# Patient Record
Sex: Female | Born: 1940 | Race: White | Hispanic: No | Smoking: Former smoker
Health system: Southern US, Community
[De-identification: ages and names within clinical notes are randomized; demographics above are authoritative.]

## PROBLEM LIST (undated history)

## (undated) DIAGNOSIS — M1990 Unspecified osteoarthritis, unspecified site: Secondary | ICD-10-CM

## (undated) DIAGNOSIS — L409 Psoriasis, unspecified: Secondary | ICD-10-CM

## (undated) DIAGNOSIS — K219 Gastro-esophageal reflux disease without esophagitis: Secondary | ICD-10-CM

## (undated) DIAGNOSIS — R350 Frequency of micturition: Secondary | ICD-10-CM

## (undated) DIAGNOSIS — R351 Nocturia: Secondary | ICD-10-CM

## (undated) DIAGNOSIS — I2510 Atherosclerotic heart disease of native coronary artery without angina pectoris: Secondary | ICD-10-CM

## (undated) DIAGNOSIS — E785 Hyperlipidemia, unspecified: Secondary | ICD-10-CM

## (undated) HISTORY — DX: Hyperlipidemia, unspecified: E78.5

## (undated) HISTORY — PX: EYE SURGERY: SHX253

## (undated) HISTORY — DX: Atherosclerotic heart disease of native coronary artery without angina pectoris: I25.10

---

## 2005-04-15 ENCOUNTER — Other Ambulatory Visit: Admission: RE | Admit: 2005-04-15 | Discharge: 2005-04-15 | Payer: Self-pay | Admitting: *Deleted

## 2005-11-30 ENCOUNTER — Ambulatory Visit (HOSPITAL_COMMUNITY): Admission: RE | Admit: 2005-11-30 | Discharge: 2005-11-30 | Payer: Self-pay | Admitting: *Deleted

## 2005-12-10 ENCOUNTER — Encounter: Admission: RE | Admit: 2005-12-10 | Discharge: 2005-12-10 | Payer: Self-pay | Admitting: *Deleted

## 2006-12-20 ENCOUNTER — Ambulatory Visit (HOSPITAL_COMMUNITY): Admission: RE | Admit: 2006-12-20 | Discharge: 2006-12-20 | Payer: Self-pay | Admitting: *Deleted

## 2007-04-25 ENCOUNTER — Other Ambulatory Visit: Admission: RE | Admit: 2007-04-25 | Discharge: 2007-04-25 | Payer: Self-pay | Admitting: *Deleted

## 2007-11-24 ENCOUNTER — Encounter: Admission: RE | Admit: 2007-11-24 | Discharge: 2007-11-24 | Payer: Self-pay

## 2008-01-23 ENCOUNTER — Ambulatory Visit (HOSPITAL_COMMUNITY): Admission: RE | Admit: 2008-01-23 | Discharge: 2008-01-23 | Payer: Self-pay | Admitting: Family Medicine

## 2009-01-30 ENCOUNTER — Ambulatory Visit (HOSPITAL_COMMUNITY): Admission: RE | Admit: 2009-01-30 | Discharge: 2009-01-30 | Payer: Self-pay | Admitting: Family Medicine

## 2010-02-02 ENCOUNTER — Ambulatory Visit (HOSPITAL_COMMUNITY): Admission: RE | Admit: 2010-02-02 | Discharge: 2010-02-02 | Payer: Self-pay | Admitting: Family Medicine

## 2010-03-16 ENCOUNTER — Encounter: Admission: RE | Admit: 2010-03-16 | Discharge: 2010-03-16 | Payer: Self-pay | Admitting: Family Medicine

## 2010-11-16 ENCOUNTER — Other Ambulatory Visit (HOSPITAL_COMMUNITY): Payer: Self-pay | Admitting: Rheumatology

## 2010-11-16 ENCOUNTER — Ambulatory Visit (HOSPITAL_COMMUNITY)
Admission: RE | Admit: 2010-11-16 | Discharge: 2010-11-16 | Disposition: A | Discharge status: Home / Self Care | Payer: Managed Care, Other (non HMO) | Source: Ambulatory Visit | Attending: Rheumatology | Admitting: Rheumatology

## 2010-11-16 DIAGNOSIS — A078 Other specified protozoal intestinal diseases: Secondary | ICD-10-CM

## 2010-11-16 DIAGNOSIS — M129 Arthropathy, unspecified: Secondary | ICD-10-CM | POA: Insufficient documentation

## 2011-03-04 ENCOUNTER — Other Ambulatory Visit (HOSPITAL_COMMUNITY): Payer: Self-pay | Admitting: Family Medicine

## 2011-03-04 DIAGNOSIS — Z1231 Encounter for screening mammogram for malignant neoplasm of breast: Secondary | ICD-10-CM

## 2011-03-12 ENCOUNTER — Ambulatory Visit (HOSPITAL_COMMUNITY)
Admission: RE | Admit: 2011-03-12 | Discharge: 2011-03-12 | Disposition: A | Discharge status: Home / Self Care | Payer: Managed Care, Other (non HMO) | Source: Ambulatory Visit | Attending: Family Medicine | Admitting: Family Medicine

## 2011-03-12 DIAGNOSIS — Z1231 Encounter for screening mammogram for malignant neoplasm of breast: Secondary | ICD-10-CM

## 2012-02-03 ENCOUNTER — Other Ambulatory Visit: Payer: Self-pay | Admitting: Orthopedic Surgery

## 2012-02-03 NOTE — Progress Notes (Signed)
Preoperative surgical orders have been place into the Epic hospital system for [facility] on 02/03/2012, 5:13 PM  by Patrica Duel for surgery on 04/17/2012.  Preop Total Knee orders including Bupivacaine On-Q pump, IV Tylenol, and IV Decadron as long as there are no contraindications to the above medications.

## 2012-04-06 ENCOUNTER — Encounter (HOSPITAL_COMMUNITY): Payer: Self-pay | Admitting: Pharmacy Technician

## 2012-04-10 ENCOUNTER — Encounter (HOSPITAL_COMMUNITY)
Admission: RE | Admit: 2012-04-10 | Discharge: 2012-04-10 | Disposition: A | Discharge status: Home / Self Care | Payer: Managed Care, Other (non HMO) | Source: Ambulatory Visit | Attending: Orthopedic Surgery | Admitting: Orthopedic Surgery

## 2012-04-10 ENCOUNTER — Encounter (HOSPITAL_COMMUNITY): Payer: Self-pay

## 2012-04-10 HISTORY — DX: Frequency of micturition: R35.0

## 2012-04-10 HISTORY — DX: Gastro-esophageal reflux disease without esophagitis: K21.9

## 2012-04-10 HISTORY — DX: Psoriasis, unspecified: L40.9

## 2012-04-10 HISTORY — DX: Unspecified osteoarthritis, unspecified site: M19.90

## 2012-04-10 HISTORY — DX: Nocturia: R35.1

## 2012-04-10 LAB — CBC
MCH: 28.1 pg (ref 26.0–34.0)
MCHC: 31.7 g/dL (ref 30.0–36.0)
MCV: 88.7 fL (ref 78.0–100.0)
Platelets: 264 10*3/uL (ref 150–400)
RBC: 4.87 MIL/uL (ref 3.87–5.11)
RDW: 13 % (ref 11.5–15.5)

## 2012-04-10 LAB — URINALYSIS, ROUTINE W REFLEX MICROSCOPIC
Ketones, ur: NEGATIVE mg/dL
Leukocytes, UA: NEGATIVE
Nitrite: NEGATIVE
Protein, ur: NEGATIVE mg/dL
pH: 6.5 (ref 5.0–8.0)

## 2012-04-10 LAB — COMPREHENSIVE METABOLIC PANEL
ALT: 15 U/L (ref 0–35)
AST: 16 U/L (ref 0–37)
CO2: 26 mEq/L (ref 19–32)
Calcium: 9.2 mg/dL (ref 8.4–10.5)
Creatinine, Ser: 0.7 mg/dL (ref 0.50–1.10)
GFR calc non Af Amer: 86 mL/min — ABNORMAL LOW (ref >90)
Sodium: 137 mEq/L (ref 135–145)
Total Protein: 7.1 g/dL (ref 6.0–8.3)

## 2012-04-10 LAB — SURGICAL PCR SCREEN: MRSA, PCR: NEGATIVE

## 2012-04-10 LAB — PROTIME-INR: INR: 0.92 (ref 0.00–1.49)

## 2012-04-10 MED ORDER — CHLORHEXIDINE GLUCONATE 4 % EX LIQD
60.0000 mL | Freq: Once | CUTANEOUS | Status: DC
  Filled 2012-04-10: qty 60

## 2012-04-10 NOTE — Patient Instructions (Addendum)
[address] Alfredo  04/10/2012   Your procedure is scheduled on:  04-17-12 AT 2:00 PM  Report to SHORT STAY DEPT  at 11:30 AM.  Call this number if you have problems the morning of surgery: [phone]   Remember:   Do not eat food or drink liquids AFTER MIDNIGHT  May have clear liquids UNTIL 6 HOURS BEFORE SURGERY (8:00 AM)  Clear liquids include soda, tea, black coffee, apple or grape juice, broth.  Take these medicines the morning of surgery with A SIP OF WATER: NONE   Do not wear jewelry, make-up or nail polish.  Do not wear lotions, powders, or perfumes.   Do not shave legs or underarms 48 hrs. before surgery (men may shave face)  Do not bring valuables to the hospital.  Contacts, dentures or bridgework may not be worn into surgery.  Leave suitcase in the car. After surgery it may be brought to your room.  For patients admitted to the hospital, checkout time is 11:00 AM the day of discharge.   Patients discharged the day of surgery will not be allowed to drive home.    Special Instructions:   Please read over the following fact sheets that you were given: MRSA  Information / Incentive Spirometer               SHOWER WITH BETASEPT THE NIGHT BEFORE SURGERY AND THE MORNING OF SURGERY

## 2012-04-16 ENCOUNTER — Other Ambulatory Visit: Payer: Self-pay | Admitting: Orthopedic Surgery

## 2012-04-16 NOTE — H&P (Signed)
Alyssa Jacobs  DOB: 01-18-1941 Divorced / Language: English / Race: White / Female  Date of Admission:  04/17/2012  Chief Complaint:  Right Knee Pain  History of Present Illness The patient is a 71 year old female who comes in for a preoperative History and Physical. The patient is scheduled for a right total knee arthroplasty to be performed by Dr. Gus Rankin. Aluisio, MD at [facility] on 04/17/2012. The patient is a 71 year old female who presents with knee complaints. The patient reports right knee symptoms including: pain which began year(s) (last seen 06/15/10) ago without any known injury.The patient feels that the symptoms are worsening. Past treatment for this problem has included intra-articular injection of corticosteroids (that did provide some relief). and include knee pain (that increases with weather changes), stiffness, locking and instability. Current treatment includes nonsteroidal anti-inflammatory drugs (Aleve). She said that the right knee hurts at all times. It's limiting what she can and cannot do. She is hurting at night. She has had cortisone in the past without benefit. She's at a stage where she wants to get this fixed. she is ready to proceed with surgery. They have been treated conservatively in the past for the above stated problem and despite conservative measures, they continue to have progressive pain and severe functional limitations and dysfunction. They have failed non-operative management including home exercise, medications, and injections. It is felt that they would benefit from undergoing total joint replacement. Risks and benefits of the procedure have been discussed with the patient and they elect to proceed with surgery. There are no active contraindications to surgery such as ongoing infection or rapidly progressive neurological disease.     Allergies Sulfa Drugs Codeine/Codeine Derivatives   Family History Osteoarthritis.  mother Severe allergy. mother Depression. brother Hypertension. mother Father. Deceased. age 39; psoriasis Mother. Deceased. age 83; colon polpys, dementia   Social History Number of flights of stairs before winded. 2-3 Marital status. divorced Living situation. live alone Tobacco use. former smoker Tobacco / smoke exposure. yes outdoors only Pain Contract. no Illicit drug use. no Current work status. working full time Children. 2 Alcohol use. never consumed alcohol Exercise. Exercises weekly; does running / walking Drug/Alcohol Rehab (Previously). no Drug/Alcohol Rehab (Currently). no   Medication History Aleve (1 Oral) Specific dose unknown - Active. Multiple Vitamin (1 Oral) Active. Vitamin D (1 Oral) Specific dose unknown - Active.   Past Surgical History Tonsillectomy. 1960's Colon Polyp Removal - Colonoscopy   Past Medical History Osteoarthritis Gastroesophageal Reflux Disease Colonic Polyps Psoriasis Osteopenia Obesity Vitamin D Deficiency   Review of Systems General:Not Present- Chills, Fever, Night Sweats, Fatigue, Weight Gain, Weight Loss and Memory Loss. Skin:Not Present- Hives, Itching, Rash, Eczema and Lesions. HEENT:Not Present- Tinnitus, Headache, Double Vision, Visual Loss, Hearing Loss and Dentures. Respiratory:Not Present- Shortness of breath with exertion, Shortness of breath at rest, Allergies, Coughing up blood and Chronic Cough. Cardiovascular:Not Present- Chest Pain, Racing/skipping heartbeats, Difficulty Breathing Lying Down, Murmur, Swelling and Palpitations. Gastrointestinal:Not Present- Bloody Stool, Heartburn, Abdominal Pain, Vomiting, Nausea, Constipation, Diarrhea, Difficulty Swallowing, Jaundice and Loss of appetitie. Female Genitourinary:Not Present- Blood in Urine, Urinary frequency, Weak urinary stream, Discharge, Flank Pain, Incontinence, Painful Urination, Urgency, Urinary Retention and Urinating at  Night. Musculoskeletal:Present- Muscle Weakness, Joint Swelling, Joint Pain, Back Pain and Morning Stiffness. Not Present- Muscle Pain and Spasms. Neurological:Not Present- Tremor, Dizziness, Blackout spells, Paralysis, Difficulty with balance and Weakness. Psychiatric:Not Present- Insomnia.   Vitals Weight: 171 lb Height: 64 in Body  Surface Area: 1.87 m Body Mass Index: 29.35 kg/m Pulse: 64 (Regular) Resp.: 14 (Unlabored) BP: 122/54 (Sitting, Right Arm, Standard)    Physical Exam The physical exam findings are as follows: Patient is a 71 year ols femake with continued knee pain. Patient is accompanied today by her daughter.   General Mental Status - Alert, cooperative and good historian. General Appearance- pleasant. Not in acute distress. Orientation- Oriented X3. Build & Nutrition- Well nourished and Well developed.   Head and Neck Head- normocephalic, atraumatic . Neck Global Assessment- supple. no bruit auscultated on the right and no bruit auscultated on the left.   Eye Pupil- Bilateral- Regular and Round. Note: wears glasses Motion- Bilateral- EOMI.   Chest and Lung Exam Auscultation: Breath sounds:- clear at anterior chest wall and - clear at posterior chest wall. Adventitious sounds:- No Adventitious sounds.   Cardiovascular Auscultation:Rhythm- Regular rate and rhythm. Heart Sounds- S1 WNL and S2 WNL. Murmurs & Other Heart Sounds:Auscultation of the heart reveals - No Murmurs.   Abdomen Palpation/Percussion:Tenderness- Abdomen is non-tender to palpation. Rigidity (guarding)- Abdomen is soft. Auscultation:Auscultation of the abdomen reveals - Bowel sounds normal.   Female Genitourinary Not done, not pertinent to present illness  Musculoskeletal On exam, she is alert and oriented, in no apparent distress. Her hips show normal ROM with no discomfort. Left knee exam is unremarkable. Right knee range is  5-125. There is marked crepitus on ROM. She is tender medial greater than lateral with no instability. She does have a psoriasis plaque over the anterior and more on the medial aspect of the knee but is covering midline.  RADIOGRAPHS: AP of both knees and lateral show advanced end stage arthritis of the right knee. She has subchondral sclerosis, tibial subluxation and bone on bone change medially.  Assessment & Plan Osteoarthritis, Knee (715.96) Impression: Right Knee  Note: Patient is for a Right Total Knee Repalcement by Dr. Lequita Halt.  Plan is for skilled rehab facility. Will get the socail worker involved after surgery to look into the options.  PCP - Dr. Sigmund Hazel - Patient had been seen preoperatively and felt to be stable for surgery.  Signed electronically by Roberts Gaudy, PA-C

## 2012-04-17 ENCOUNTER — Ambulatory Visit (HOSPITAL_COMMUNITY): Payer: Managed Care, Other (non HMO) | Admitting: Anesthesiology

## 2012-04-17 ENCOUNTER — Encounter (HOSPITAL_COMMUNITY): Payer: Self-pay | Admitting: *Deleted

## 2012-04-17 ENCOUNTER — Encounter (HOSPITAL_COMMUNITY): Payer: Self-pay | Admitting: Anesthesiology

## 2012-04-17 ENCOUNTER — Inpatient Hospital Stay (HOSPITAL_COMMUNITY)
Admission: RE | Admit: 2012-04-17 | Discharge: 2012-04-20 | DRG: 470 | Disposition: A | Discharge status: Skilled Nursing Facility | Payer: Managed Care, Other (non HMO) | Source: Ambulatory Visit | Attending: Orthopedic Surgery | Admitting: Orthopedic Surgery

## 2012-04-17 ENCOUNTER — Encounter (HOSPITAL_COMMUNITY)
Admission: RE | Disposition: A | Discharge status: Skilled Nursing Facility | Payer: Self-pay | Source: Ambulatory Visit | Attending: Orthopedic Surgery

## 2012-04-17 DIAGNOSIS — E669 Obesity, unspecified: Secondary | ICD-10-CM | POA: Diagnosis present

## 2012-04-17 DIAGNOSIS — K219 Gastro-esophageal reflux disease without esophagitis: Secondary | ICD-10-CM | POA: Diagnosis present

## 2012-04-17 DIAGNOSIS — Z96659 Presence of unspecified artificial knee joint: Secondary | ICD-10-CM

## 2012-04-17 DIAGNOSIS — M949 Disorder of cartilage, unspecified: Secondary | ICD-10-CM | POA: Diagnosis present

## 2012-04-17 DIAGNOSIS — M1710 Unilateral primary osteoarthritis, unspecified knee: Principal | ICD-10-CM | POA: Diagnosis present

## 2012-04-17 DIAGNOSIS — E871 Hypo-osmolality and hyponatremia: Secondary | ICD-10-CM

## 2012-04-17 DIAGNOSIS — Z6828 Body mass index (BMI) 28.0-28.9, adult: Secondary | ICD-10-CM

## 2012-04-17 DIAGNOSIS — E559 Vitamin D deficiency, unspecified: Secondary | ICD-10-CM | POA: Diagnosis present

## 2012-04-17 DIAGNOSIS — Z882 Allergy status to sulfonamides status: Secondary | ICD-10-CM

## 2012-04-17 DIAGNOSIS — M899 Disorder of bone, unspecified: Secondary | ICD-10-CM | POA: Diagnosis present

## 2012-04-17 DIAGNOSIS — M179 Osteoarthritis of knee, unspecified: Secondary | ICD-10-CM | POA: Diagnosis present

## 2012-04-17 HISTORY — PX: TOTAL KNEE ARTHROPLASTY: SHX125

## 2012-04-17 LAB — TYPE AND SCREEN: ABO/RH(D): A POS

## 2012-04-17 SURGERY — ARTHROPLASTY, KNEE, TOTAL
Anesthesia: Spinal | Site: Knee | Laterality: Right | Wound class: Clean

## 2012-04-17 MED ORDER — ONDANSETRON HCL 4 MG/2ML IJ SOLN: 4.0000 mg | Freq: Four times a day (QID) | INTRAMUSCULAR | Status: DC | PRN

## 2012-04-17 MED ORDER — ACETAMINOPHEN 650 MG RE SUPP: 650.0000 mg | Freq: Four times a day (QID) | RECTAL | Status: DC | PRN

## 2012-04-17 MED ORDER — PHENOL 1.4 % MT LIQD
1.0000 | OROMUCOSAL | Status: DC | PRN
  Filled 2012-04-17: qty 177

## 2012-04-17 MED ORDER — TRAMADOL HCL 50 MG PO TABS: 50.0000 mg | ORAL_TABLET | Freq: Four times a day (QID) | ORAL | Status: DC | PRN

## 2012-04-17 MED ORDER — CEFAZOLIN SODIUM-DEXTROSE 2-3 GM-% IV SOLR
INTRAVENOUS | Status: AC
  Filled 2012-04-17: qty 50

## 2012-04-17 MED ORDER — DIPHENHYDRAMINE HCL 12.5 MG/5ML PO ELIX: 12.5000 mg | ORAL_SOLUTION | ORAL | Status: DC | PRN

## 2012-04-17 MED ORDER — RIVAROXABAN 10 MG PO TABS
10.0000 mg | ORAL_TABLET | Freq: Every day | ORAL | Status: DC
  Administered 2012-04-18 – 2012-04-20 (×3): 10 mg via ORAL
  Filled 2012-04-17 (×4): qty 1

## 2012-04-17 MED ORDER — PROPOFOL 10 MG/ML IV EMUL
INTRAVENOUS | Status: DC | PRN
  Administered 2012-04-17: 120 ug/kg/min via INTRAVENOUS

## 2012-04-17 MED ORDER — EPHEDRINE SULFATE 50 MG/ML IJ SOLN
INTRAMUSCULAR | Status: DC | PRN
  Administered 2012-04-17 (×4): 5 mg via INTRAVENOUS

## 2012-04-17 MED ORDER — BUPIVACAINE ON-Q PAIN PUMP (FOR ORDER SET NO CHG)
INJECTION | Status: DC
  Filled 2012-04-17: qty 1

## 2012-04-17 MED ORDER — LACTATED RINGERS IV SOLN
INTRAVENOUS | Status: DC
  Administered 2012-04-17 (×2): via INTRAVENOUS
  Administered 2012-04-17: 1000 mL via INTRAVENOUS

## 2012-04-17 MED ORDER — ACETAMINOPHEN 10 MG/ML IV SOLN
INTRAVENOUS | Status: AC
  Filled 2012-04-17: qty 100

## 2012-04-17 MED ORDER — TEMAZEPAM 15 MG PO CAPS: 15.0000 mg | ORAL_CAPSULE | Freq: Every evening | ORAL | Status: DC | PRN

## 2012-04-17 MED ORDER — DEXTROSE-NACL 5-0.45 % IV SOLN
INTRAVENOUS | Status: DC
  Administered 2012-04-17 – 2012-04-18 (×2): via INTRAVENOUS
  Administered 2012-04-19: 30 mL/h via INTRAVENOUS

## 2012-04-17 MED ORDER — ONDANSETRON HCL 4 MG/2ML IJ SOLN
INTRAMUSCULAR | Status: DC | PRN
  Administered 2012-04-17: 4 mg via INTRAVENOUS

## 2012-04-17 MED ORDER — MIDAZOLAM HCL 5 MG/5ML IJ SOLN
INTRAMUSCULAR | Status: DC | PRN
  Administered 2012-04-17 (×2): 1 mg via INTRAVENOUS

## 2012-04-17 MED ORDER — CEFAZOLIN SODIUM 1-5 GM-% IV SOLN
1.0000 g | Freq: Four times a day (QID) | INTRAVENOUS | Status: AC
  Administered 2012-04-17 – 2012-04-18 (×2): 1 g via INTRAVENOUS
  Filled 2012-04-17 (×2): qty 50

## 2012-04-17 MED ORDER — BISACODYL 10 MG RE SUPP: 10.0000 mg | Freq: Every day | RECTAL | Status: DC | PRN

## 2012-04-17 MED ORDER — ACETAMINOPHEN 10 MG/ML IV SOLN
1000.0000 mg | Freq: Once | INTRAVENOUS | Status: AC
  Administered 2012-04-17: 1000 mg via INTRAVENOUS

## 2012-04-17 MED ORDER — METHOCARBAMOL 500 MG PO TABS
500.0000 mg | ORAL_TABLET | Freq: Four times a day (QID) | ORAL | Status: DC | PRN
  Administered 2012-04-18: 500 mg via ORAL
  Filled 2012-04-17: qty 1

## 2012-04-17 MED ORDER — BUPIVACAINE 0.25 % ON-Q PUMP SINGLE CATH 300ML: 300.0000 mL | INJECTION | Status: DC

## 2012-04-17 MED ORDER — FENTANYL CITRATE 0.05 MG/ML IJ SOLN
INTRAMUSCULAR | Status: DC | PRN
  Administered 2012-04-17 (×3): 50 ug via INTRAVENOUS

## 2012-04-17 MED ORDER — BUPIVACAINE 0.25 % ON-Q PUMP SINGLE CATH 300ML
INJECTION | Status: DC | PRN
  Administered 2012-04-17: 300 mL

## 2012-04-17 MED ORDER — SODIUM CHLORIDE 0.9 % IR SOLN
Status: DC | PRN
  Administered 2012-04-17: 1000 mL

## 2012-04-17 MED ORDER — HYDROMORPHONE HCL 2 MG PO TABS
2.0000 mg | ORAL_TABLET | ORAL | Status: DC | PRN
  Administered 2012-04-17: 2 mg via ORAL
  Administered 2012-04-18 (×2): 4 mg via ORAL
  Administered 2012-04-18 – 2012-04-20 (×5): 2 mg via ORAL
  Filled 2012-04-17 (×3): qty 1
  Filled 2012-04-17: qty 2
  Filled 2012-04-17: qty 1
  Filled 2012-04-17: qty 2
  Filled 2012-04-17 (×2): qty 1

## 2012-04-17 MED ORDER — DEXAMETHASONE SODIUM PHOSPHATE 10 MG/ML IJ SOLN
10.0000 mg | Freq: Once | INTRAMUSCULAR | Status: DC
  Filled 2012-04-17: qty 1

## 2012-04-17 MED ORDER — METHOCARBAMOL 100 MG/ML IJ SOLN
500.0000 mg | Freq: Four times a day (QID) | INTRAMUSCULAR | Status: DC | PRN
  Administered 2012-04-17: 500 mg via INTRAVENOUS
  Filled 2012-04-17 (×2): qty 5

## 2012-04-17 MED ORDER — POLYETHYLENE GLYCOL 3350 17 G PO PACK: 17.0000 g | PACK | Freq: Every day | ORAL | Status: DC | PRN

## 2012-04-17 MED ORDER — 0.9 % SODIUM CHLORIDE (POUR BTL) OPTIME
TOPICAL | Status: DC | PRN
  Administered 2012-04-17: 1000 mL

## 2012-04-17 MED ORDER — ACETAMINOPHEN 325 MG PO TABS: 650.0000 mg | ORAL_TABLET | Freq: Four times a day (QID) | ORAL | Status: DC | PRN

## 2012-04-17 MED ORDER — FLEET ENEMA 7-19 GM/118ML RE ENEM: 1.0000 | ENEMA | Freq: Once | RECTAL | Status: AC | PRN

## 2012-04-17 MED ORDER — HYDROMORPHONE HCL PF 1 MG/ML IJ SOLN: 0.2500 mg | INTRAMUSCULAR | Status: DC | PRN

## 2012-04-17 MED ORDER — ACETAMINOPHEN 10 MG/ML IV SOLN
1000.0000 mg | Freq: Four times a day (QID) | INTRAVENOUS | Status: AC
  Administered 2012-04-17 – 2012-04-18 (×4): 1000 mg via INTRAVENOUS
  Filled 2012-04-17 (×7): qty 100

## 2012-04-17 MED ORDER — METOCLOPRAMIDE HCL 10 MG PO TABS: 5.0000 mg | ORAL_TABLET | Freq: Three times a day (TID) | ORAL | Status: DC | PRN

## 2012-04-17 MED ORDER — DOCUSATE SODIUM 100 MG PO CAPS
100.0000 mg | ORAL_CAPSULE | Freq: Two times a day (BID) | ORAL | Status: DC
  Administered 2012-04-17 – 2012-04-20 (×5): 100 mg via ORAL

## 2012-04-17 MED ORDER — SODIUM CHLORIDE 0.9 % IV SOLN: INTRAVENOUS | Status: DC

## 2012-04-17 MED ORDER — MENTHOL 3 MG MT LOZG
1.0000 | LOZENGE | OROMUCOSAL | Status: DC | PRN
  Filled 2012-04-17: qty 9

## 2012-04-17 MED ORDER — BUPIVACAINE 0.25 % ON-Q PUMP SINGLE CATH 300ML
INJECTION | Status: AC
  Filled 2012-04-17: qty 300

## 2012-04-17 MED ORDER — ONDANSETRON HCL 4 MG PO TABS
4.0000 mg | ORAL_TABLET | Freq: Four times a day (QID) | ORAL | Status: DC | PRN
  Administered 2012-04-19: 4 mg via ORAL
  Filled 2012-04-17: qty 1

## 2012-04-17 MED ORDER — HYDROMORPHONE HCL PF 1 MG/ML IJ SOLN
0.5000 mg | INTRAMUSCULAR | Status: DC | PRN
  Administered 2012-04-18 (×2): 1 mg via INTRAVENOUS
  Filled 2012-04-17 (×2): qty 1

## 2012-04-17 MED ORDER — METOCLOPRAMIDE HCL 5 MG/ML IJ SOLN: 5.0000 mg | Freq: Three times a day (TID) | INTRAMUSCULAR | Status: DC | PRN

## 2012-04-17 MED ORDER — DEXAMETHASONE SODIUM PHOSPHATE 4 MG/ML IJ SOLN
INTRAMUSCULAR | Status: DC | PRN
  Administered 2012-04-17: 10 mg via INTRAVENOUS

## 2012-04-17 MED ORDER — CEFAZOLIN SODIUM-DEXTROSE 2-3 GM-% IV SOLR
2.0000 g | INTRAVENOUS | Status: AC
  Administered 2012-04-17: 2 g via INTRAVENOUS

## 2012-04-17 MED ORDER — LACTATED RINGERS IV SOLN: INTRAVENOUS | Status: DC

## 2012-04-17 SURGICAL SUPPLY — 54 items
BAG SPEC THK2 15X12 ZIP CLS (MISCELLANEOUS) ×1
BAG ZIPLOCK 12X15 (MISCELLANEOUS) ×2 IMPLANT
BANDAGE ELASTIC 6 VELCRO ST LF (GAUZE/BANDAGES/DRESSINGS) ×2 IMPLANT
BANDAGE ESMARK 6X9 LF (GAUZE/BANDAGES/DRESSINGS) ×1 IMPLANT
BLADE SAG 18X100X1.27 (BLADE) ×2 IMPLANT
BLADE SAW SGTL 11.0X1.19X90.0M (BLADE) ×2 IMPLANT
BNDG CMPR 9X6 STRL LF SNTH (GAUZE/BANDAGES/DRESSINGS) ×1
BNDG ESMARK 6X9 LF (GAUZE/BANDAGES/DRESSINGS) ×2
BOWL SMART MIX CTS (DISPOSABLE) ×2 IMPLANT
CATH KIT ON-Q SILVERSOAK 5 (CATHETERS) ×1 IMPLANT
CATH KIT ON-Q SILVERSOAK 5IN (CATHETERS) ×2 IMPLANT
CEMENT HV SMART SET (Cement) ×4 IMPLANT
CLOTH BEACON ORANGE TIMEOUT ST (SAFETY) ×2 IMPLANT
CUFF TOURN SGL QUICK 34 (TOURNIQUET CUFF) ×2
CUFF TRNQT CYL 34X4X40X1 (TOURNIQUET CUFF) ×1 IMPLANT
DRAPE EXTREMITY T 121X128X90 (DRAPE) ×2 IMPLANT
DRAPE POUCH INSTRU U-SHP 10X18 (DRAPES) ×2 IMPLANT
DRAPE U-SHAPE 47X51 STRL (DRAPES) ×2 IMPLANT
DRSG ADAPTIC 3X8 NADH LF (GAUZE/BANDAGES/DRESSINGS) ×2 IMPLANT
DRSG PAD ABDOMINAL 8X10 ST (GAUZE/BANDAGES/DRESSINGS) ×1 IMPLANT
DURAPREP 26ML APPLICATOR (WOUND CARE) ×2 IMPLANT
ELECT REM PT RETURN 9FT ADLT (ELECTROSURGICAL) ×2
ELECTRODE REM PT RTRN 9FT ADLT (ELECTROSURGICAL) ×1 IMPLANT
EVACUATOR 1/8 PVC DRAIN (DRAIN) ×2 IMPLANT
FACESHIELD LNG OPTICON STERILE (SAFETY) ×10 IMPLANT
GLOVE BIO SURGEON STRL SZ7.5 (GLOVE) ×2 IMPLANT
GLOVE BIO SURGEON STRL SZ8 (GLOVE) ×2 IMPLANT
GLOVE BIOGEL PI IND STRL 8 (GLOVE) ×2 IMPLANT
GLOVE BIOGEL PI INDICATOR 8 (GLOVE) ×2
GOWN STRL NON-REIN LRG LVL3 (GOWN DISPOSABLE) ×2 IMPLANT
GOWN STRL REIN XL XLG (GOWN DISPOSABLE) ×2 IMPLANT
HANDPIECE INTERPULSE COAX TIP (DISPOSABLE) ×2
IMMOBILIZER KNEE 20 (SOFTGOODS) ×2
IMMOBILIZER KNEE 20 THIGH 36 (SOFTGOODS) ×1 IMPLANT
KIT BASIN OR (CUSTOM PROCEDURE TRAY) ×2 IMPLANT
MANIFOLD NEPTUNE II (INSTRUMENTS) ×2 IMPLANT
NS IRRIG 1000ML POUR BTL (IV SOLUTION) ×2 IMPLANT
PACK TOTAL JOINT (CUSTOM PROCEDURE TRAY) ×2 IMPLANT
PAD ABD 7.5X8 STRL (GAUZE/BANDAGES/DRESSINGS) ×2 IMPLANT
PADDING CAST COTTON 6X4 STRL (CAST SUPPLIES) ×4 IMPLANT
POSITIONER SURGICAL ARM (MISCELLANEOUS) ×2 IMPLANT
SET HNDPC FAN SPRY TIP SCT (DISPOSABLE) ×1 IMPLANT
SPONGE GAUZE 4X4 12PLY (GAUZE/BANDAGES/DRESSINGS) ×2 IMPLANT
STRIP CLOSURE SKIN 1/2X4 (GAUZE/BANDAGES/DRESSINGS) ×4 IMPLANT
SUCTION FRAZIER 12FR DISP (SUCTIONS) ×2 IMPLANT
SUT MNCRL AB 4-0 PS2 18 (SUTURE) ×2 IMPLANT
SUT PDS AB 1 CT1 27 (SUTURE) ×3 IMPLANT
SUT VIC AB 2-0 CT1 27 (SUTURE) ×6
SUT VIC AB 2-0 CT1 TAPERPNT 27 (SUTURE) ×3 IMPLANT
SUT VLOC 180 0 24IN GS25 (SUTURE) ×2 IMPLANT
TOWEL OR 17X26 10 PK STRL BLUE (TOWEL DISPOSABLE) ×4 IMPLANT
TRAY FOLEY CATH 14FRSI W/METER (CATHETERS) ×2 IMPLANT
WATER STERILE IRR 1500ML POUR (IV SOLUTION) ×2 IMPLANT
WRAP KNEE MAXI GEL POST OP (GAUZE/BANDAGES/DRESSINGS) ×3 IMPLANT

## 2012-04-17 NOTE — H&P (View-Only) (Signed)
Alyssa Jacobs  DOB: 10/11/1940 Divorced / Language: English / Race: White / Female  Date of Admission:  04/17/2012  Chief Complaint:  Right Knee Pain  History of Present Illness The patient is a 70 year old female who comes in for a preoperative History and Physical. The patient is scheduled for a right total knee arthroplasty to be performed by Dr. Frank V. Aluisio, MD at [facility] on 04/17/2012. The patient is a 70 year old female who presents with knee complaints. The patient reports right knee symptoms including: pain which began year(s) (last seen 06/15/10) ago without any known injury.The patient feels that the symptoms are worsening. Past treatment for this problem has included intra-articular injection of corticosteroids (that did provide some relief). and include knee pain (that increases with weather changes), stiffness, locking and instability. Current treatment includes nonsteroidal anti-inflammatory drugs (Aleve). She said that the right knee hurts at all times. It's limiting what she can and cannot do. She is hurting at night. She has had cortisone in the past without benefit. She's at a stage where she wants to get this fixed. she is ready to proceed with surgery. They have been treated conservatively in the past for the above stated problem and despite conservative measures, they continue to have progressive pain and severe functional limitations and dysfunction. They have failed non-operative management including home exercise, medications, and injections. It is felt that they would benefit from undergoing total joint replacement. Risks and benefits of the procedure have been discussed with the patient and they elect to proceed with surgery. There are no active contraindications to surgery such as ongoing infection or rapidly progressive neurological disease.     Allergies Sulfa Drugs Codeine/Codeine Derivatives   Family History Osteoarthritis.  mother Severe allergy. mother Depression. brother Hypertension. mother Father. Deceased. age 85; psoriasis Mother. Deceased. age 93; colon polpys, dementia   Social History Number of flights of stairs before winded. 2-3 Marital status. divorced Living situation. live alone Tobacco use. former smoker Tobacco / smoke exposure. yes outdoors only Pain Contract. no Illicit drug use. no Current work status. working full time Children. 2 Alcohol use. never consumed alcohol Exercise. Exercises weekly; does running / walking Drug/Alcohol Rehab (Previously). no Drug/Alcohol Rehab (Currently). no   Medication History Aleve (1 Oral) Specific dose unknown - Active. Multiple Vitamin (1 Oral) Active. Vitamin D (1 Oral) Specific dose unknown - Active.   Past Surgical History Tonsillectomy. 1960's Colon Polyp Removal - Colonoscopy   Past Medical History Osteoarthritis Gastroesophageal Reflux Disease Colonic Polyps Psoriasis Osteopenia Obesity Vitamin D Deficiency   Review of Systems General:Not Present- Chills, Fever, Night Sweats, Fatigue, Weight Gain, Weight Loss and Memory Loss. Skin:Not Present- Hives, Itching, Rash, Eczema and Lesions. HEENT:Not Present- Tinnitus, Headache, Double Vision, Visual Loss, Hearing Loss and Dentures. Respiratory:Not Present- Shortness of breath with exertion, Shortness of breath at rest, Allergies, Coughing up blood and Chronic Cough. Cardiovascular:Not Present- Chest Pain, Racing/skipping heartbeats, Difficulty Breathing Lying Down, Murmur, Swelling and Palpitations. Gastrointestinal:Not Present- Bloody Stool, Heartburn, Abdominal Pain, Vomiting, Nausea, Constipation, Diarrhea, Difficulty Swallowing, Jaundice and Loss of appetitie. Female Genitourinary:Not Present- Blood in Urine, Urinary frequency, Weak urinary stream, Discharge, Flank Pain, Incontinence, Painful Urination, Urgency, Urinary Retention and Urinating at  Night. Musculoskeletal:Present- Muscle Weakness, Joint Swelling, Joint Pain, Back Pain and Morning Stiffness. Not Present- Muscle Pain and Spasms. Neurological:Not Present- Tremor, Dizziness, Blackout spells, Paralysis, Difficulty with balance and Weakness. Psychiatric:Not Present- Insomnia.   Vitals Weight: 171 lb Height: 64 in Body   Surface Area: 1.87 m Body Mass Index: 29.35 kg/m Pulse: 64 (Regular) Resp.: 14 (Unlabored) BP: 122/54 (Sitting, Right Arm, Standard)    Physical Exam The physical exam findings are as follows: Patient is a 70 year ols femake with continued knee pain. Patient is accompanied today by her daughter.   General Mental Status - Alert, cooperative and good historian. General Appearance- pleasant. Not in acute distress. Orientation- Oriented X3. Build & Nutrition- Well nourished and Well developed.   Head and Neck Head- normocephalic, atraumatic . Neck Global Assessment- supple. no bruit auscultated on the right and no bruit auscultated on the left.   Eye Pupil- Bilateral- Regular and Round. Note: wears glasses Motion- Bilateral- EOMI.   Chest and Lung Exam Auscultation: Breath sounds:- clear at anterior chest wall and - clear at posterior chest wall. Adventitious sounds:- No Adventitious sounds.   Cardiovascular Auscultation:Rhythm- Regular rate and rhythm. Heart Sounds- S1 WNL and S2 WNL. Murmurs & Other Heart Sounds:Auscultation of the heart reveals - No Murmurs.   Abdomen Palpation/Percussion:Tenderness- Abdomen is non-tender to palpation. Rigidity (guarding)- Abdomen is soft. Auscultation:Auscultation of the abdomen reveals - Bowel sounds normal.   Female Genitourinary Not done, not pertinent to present illness  Musculoskeletal On exam, she is alert and oriented, in no apparent distress. Her hips show normal ROM with no discomfort. Left knee exam is unremarkable. Right knee range is  5-125. There is marked crepitus on ROM. She is tender medial greater than lateral with no instability. She does have a psoriasis plaque over the anterior and more on the medial aspect of the knee but is covering midline.  RADIOGRAPHS: AP of both knees and lateral show advanced end stage arthritis of the right knee. She has subchondral sclerosis, tibial subluxation and bone on bone change medially.  Assessment & Plan Osteoarthritis, Knee (715.96) Impression: Right Knee  Note: Patient is for a Right Total Knee Repalcement by Dr. Aluisio.  Plan is for skilled rehab facility. Will get the socail worker involved after surgery to look into the options.  PCP - Dr. Lisa Miller - Patient had been seen preoperatively and felt to be stable for surgery.  Signed electronically by DREW L PERKINS, PA-C 

## 2012-04-17 NOTE — Anesthesia Preprocedure Evaluation (Signed)
Anesthesia Evaluation  Patient identified by MRN, date of birth, ID band Patient awake    Reviewed: Allergy & Precautions, H&P , NPO status , Patient's Chart, lab work & pertinent test results  Airway Mallampati: II TM Distance: >3 FB Neck ROM: full    Dental No notable dental hx. (+) Teeth Intact and Dental Advisory Given   Pulmonary neg pulmonary ROS,  breath sounds clear to auscultation  Pulmonary exam normal       Cardiovascular Exercise Tolerance: Good negative cardio ROS  Rhythm:regular Rate:Normal     Neuro/Psych negative neurological ROS  negative psych ROS   GI/Hepatic negative GI ROS, Neg liver ROS, GERD-  Medicated and Controlled,  Endo/Other  negative endocrine ROS  Renal/GU negative Renal ROS  negative genitourinary   Musculoskeletal   Abdominal   Peds  Hematology negative hematology ROS (+)   Anesthesia Other Findings   Reproductive/Obstetrics negative OB ROS                           Anesthesia Physical Anesthesia Plan  ASA: I  Anesthesia Plan: Spinal   Post-op Pain Management:    Induction:   Airway Management Planned: Nasal Cannula  Additional Equipment:   Intra-op Plan:   Post-operative Plan:   Informed Consent: I have reviewed the patients History and Physical, chart, labs and discussed the procedure including the risks, benefits and alternatives for the proposed anesthesia with the patient or authorized representative who has indicated his/her understanding and acceptance.   Dental Advisory Given  Plan Discussed with: CRNA and Surgeon  Anesthesia Plan Comments:         Anesthesia Quick Evaluation

## 2012-04-17 NOTE — Interval H&P Note (Signed)
History and Physical Interval Note:  04/17/2012 2:15 PM  Alyssa Jacobs  has presented today for surgery, with the diagnosis of Osteoarthritis of the Right Knee  The various methods of treatment have been discussed with the patient and family. After consideration of risks, benefits and other options for treatment, the patient has consented to  Procedure(s) (LRB): TOTAL KNEE ARTHROPLASTY (Right) as a surgical intervention .  The patient's history has been reviewed, patient examined, no change in status, stable for surgery.  I have reviewed the patient's chart and labs.  Questions were answered to the patient's satisfaction.     Loanne Drilling

## 2012-04-17 NOTE — Preoperative (Signed)
Beta Blockers   Reason not to administer Beta Blockers:Not Applicable 

## 2012-04-17 NOTE — Op Note (Signed)
Pre-operative diagnosis- Osteoarthritis  Right knee(s)  Post-operative diagnosis- Osteoarthritis Right knee(s)  Procedure-  Right  Total Knee Arthroplasty  Surgeon- Gus Rankin. Loreena Valeri, MD  Assistant- Dimitri Ped, PA-C   Anesthesia-  Spinal EBL-* No blood loss amount entered *  Drains Hemovac  Tourniquet time-  Total Tourniquet Time Documented: Thigh (Right) - 38 minutes   Complications- None  Condition-PACU - hemodynamically stable.   Brief Clinical Note  Alyssa Jacobs is a 71 y.o. year old female with end stage OA of her right knee with progressively worsening pain and dysfunction. She has constant pain, with activity and at rest and significant functional deficits with difficulties even with ADLs. She has had extensive non-op management including analgesics, injections of cortisone and viscosupplements, and home exercise program, but remains in significant pain with significant dysfunction. Radiographs show bone on bone arthritis medial and patellofemoral. She presents now for right Total Knee Arthroplasty.     Procedure in detail---   The patient is brought into the operating room and positioned supine on the operating table. After successful administration of  Spinal,   a tourniquet is placed high on the  Right thigh(s) and the lower extremity is prepped and draped in the usual sterile fashion. Time out is performed by the operating team and then the  Right lower extremity is wrapped in Esmarch, knee flexed and the tourniquet inflated to 300 mmHg.       A midline incision is made with a ten blade through the subcutaneous tissue to the level of the extensor mechanism. A fresh blade is used to make a medial parapatellar arthrotomy. Soft tissue over the proximal medial tibia is subperiosteally elevated to the joint line with a knife and into the semimembranosus bursa with a Cobb elevator. Soft tissue over the proximal lateral tibia is elevated with attention being paid to avoiding the  patellar tendon on the tibial tubercle. The patella is everted, knee flexed 90 degrees and the ACL and PCL are removed. Findings are bone on bone medial and patellofemoral with large medial osteophytes        The drill is used to create a starting hole in the distal femur and the canal is thoroughly irrigated with sterile saline to remove the fatty contents. The 5 degree Right  valgus alignment guide is placed into the femoral canal and the distal femoral cutting block is pinned to remove 10 mm off the distal femur. Resection is made with an oscillating saw.      The tibia is subluxed forward and the menisci are removed. The extramedullary alignment guide is placed referencing proximally at the medial aspect of the tibial tubercle and distally along the second metatarsal axis and tibial crest. The block is pinned to remove 2mm off the more deficient medial  side. Resection is made with an oscillating saw. Size 3is the most appropriate size for the tibia and the proximal tibia is prepared with the modular drill and keel punch for that size.      The femoral sizing guide is placed and size 3 is most appropriate. Rotation is marked off the epicondylar axis and confirmed by creating a rectangular flexion gap at 90 degrees. The size 3 cutting block is pinned in this rotation and the anterior, posterior and chamfer cuts are made with the oscillating saw. The intercondylar block is then placed and that cut is made.      Trial size 3 tibial component, trial size 3 posterior stabilized femur and a 10  mm posterior stabilized rotating platform insert trial is placed. Full extension is achieved with excellent varus/valgus and anterior/posterior balance throughout full range of motion. The patella is everted and thickness measured to be 22  mm. Free hand resection is taken to 12 mm, a 35 template is placed, lug holes are drilled, trial patella is placed, and it tracks normally. Osteophytes are removed off the posterior  femur with the trial in place. All trials are removed and the cut bone surfaces prepared with pulsatile lavage. Cement is mixed and once ready for implantation, the size 3 tibial implant, size  3 posterior stabilized femoral component, and the size 35 patella are cemented in place and the patella is held with the clamp. The trial insert is placed and the knee held in full extension. All extruded cement is removed and once the cement is hard the permanent 10 mm posterior stabilized rotating platform insert is placed into the tibial tray.      The wound is copiously irrigated with saline solution and the extensor mechanism closed over a hemovac drain with #1 PDS suture. The tourniquet is released for a total tourniquet time of 37  minutes. Flexion against gravity is 140 degrees and the patella tracks normally. Subcutaneous tissue is closed with 2.0 vicryl and subcuticular with running 4.0 Monocryl. The catheter for the Marcaine pain pump is placed and the pump is initiated. The incision is cleaned and dried and steri-strips and a bulky sterile dressing are applied. The limb is placed into a knee immobilizer and the patient is awakened and transported to recovery in stable condition.      Please note that a surgical assistant was a medical necessity for this procedure in order to perform it in a safe and expeditious manner. Surgical assistant was necessary to retract the ligaments and vital neurovascular structures to prevent injury to them and also necessary for proper positioning of the limb to allow for anatomic placement of the prosthesis.   Gus Rankin Zyara Riling, MD    04/17/2012, 3:59 PM

## 2012-04-17 NOTE — Anesthesia Postprocedure Evaluation (Signed)
  Anesthesia Post-op Note  Patient: Theatre manager  Procedure(s) Performed: Procedure(s) (LRB): TOTAL KNEE ARTHROPLASTY (Right)  Patient Location: PACU  Anesthesia Type: Spinal  Level of Consciousness: awake and alert   Airway and Oxygen Therapy: Patient Spontanous Breathing  Post-op Pain: mild  Post-op Assessment: Post-op Vital signs reviewed, Patient's Cardiovascular Status Stable, Respiratory Function Stable, Patent Airway and No signs of Nausea or vomiting  Post-op Vital Signs: stable  Complications: No apparent anesthesia complications

## 2012-04-17 NOTE — Anesthesia Procedure Notes (Addendum)
Anesthesia Regional Block:   Narrative:    Spinal  Patient location during procedure: OR Start time: 04/17/2012 2:35 PM Staffing CRNA/Resident: Phillips Grout E Performed by: resident/CRNA  Preanesthetic Checklist Completed: patient identified, site marked, surgical consent, pre-op evaluation, timeout performed, IV checked, risks and benefits discussed and monitors and equipment checked Spinal Block Patient position: sitting Prep: Betadine Patient monitoring: blood pressure, continuous pulse ox, cardiac monitor and heart rate Approach: midline Location: L3-4 Injection technique: single-shot Needle Needle type: Spinocan  Needle gauge: 22 G Needle length: 9 cm Assessment Sensory level: T8 Additional Notes Tolerated spinal well.  Negative heme, paresthesia, good csf flow, repositioned twice and injected by Dr.Ewell to verify aspiration csf. Lot no. [id number].  Expiration 05/2013

## 2012-04-17 NOTE — Transfer of Care (Signed)
Immediate Anesthesia Transfer of Care Note  Patient: Alyssa Jacobs  Procedure(s) Performed: Procedure(s) (LRB): TOTAL KNEE ARTHROPLASTY (Right)  Patient Location: PACU  Anesthesia Type: Spinal  Level of Consciousness: awake, alert , oriented and patient cooperative  Airway & Oxygen Therapy: Patient Spontanous Breathing and Patient connected to face mask oxygen  Post-op Assessment: Report given to PACU RN and Post -op Vital signs reviewed and stable  Post vital signs: Reviewed and stable  Complications: No apparent anesthesia complications

## 2012-04-18 LAB — CBC
HCT: 36.8 % (ref 36.0–46.0)
Hemoglobin: 11.6 g/dL — ABNORMAL LOW (ref 12.0–15.0)
MCH: 27.8 pg (ref 26.0–34.0)
MCHC: 31.5 g/dL (ref 30.0–36.0)
MCV: 88.2 fL (ref 78.0–100.0)

## 2012-04-18 LAB — BASIC METABOLIC PANEL
BUN: 9 mg/dL (ref 6–23)
Calcium: 8.6 mg/dL (ref 8.4–10.5)
Creatinine, Ser: 0.56 mg/dL (ref 0.50–1.10)
GFR calc non Af Amer: >90 mL/min (ref >90)
Glucose, Bld: 154 mg/dL — ABNORMAL HIGH (ref 70–99)
Potassium: 4.6 mEq/L (ref 3.5–5.1)

## 2012-04-18 NOTE — Progress Notes (Signed)
Clinical Social Work Department BRIEF PSYCHOSOCIAL ASSESSMENT 04/18/2012  Patient:  Alyssa Jacobs,Alyssa Jacobs     Account Number:  [id number]     Admit date:  04/17/2012  Clinical Social Worker:  Candie Chroman  Date/Time:  04/18/2012 04:12 PM  Referred by:  Physician  Date Referred:  04/18/2012 Referred for  SNF Placement   Other Referral:   Interview type:  Patient Other interview type:    PSYCHOSOCIAL DATA Living Status:  ALONE Admitted from facility:   Level of care:   Primary support name:  [place] Primary support relationship to patient:  CHILD, ADULT Degree of support available:   supportive    CURRENT CONCERNS Current Concerns  Post-Acute Placement   Other Concerns:    SOCIAL WORK ASSESSMENT / PLAN Pt is a 71 yr old female living at home prior to hospitalization. CSW met with pt/daughter to assist with d/c planning. PT recommends ST rehab following hospital d/c . Pt / daughter are in agreement with this plan. SNF search has been initiated and bed offers provided. Pt's daughter will tour facilities prior to making a SNF choice. Pt has [facility] which requires prior approval for SNF placement. CSW will asist with this approval process.   Assessment/plan status:  Psychosocial Support/Ongoing Assessment of Needs Other assessment/ plan:   Information/referral to community resources:   SNF list provided.    PATIENT'S/FAMILY'S RESPONSE TO PLAN OF CARE: Pt feels ST Rehab would be helpful prior to returning home.    [name] LCSW [phone]

## 2012-04-18 NOTE — Progress Notes (Signed)
Utilization review completed.  

## 2012-04-18 NOTE — Evaluation (Signed)
Physical Therapy Evaluation Patient Details Name: Alyssa Jacobs MRN: [id number] DOB: [birth year] Today's Date: 04/18/2012 Time: 4098-1191 PT Time Calculation (min): 24 min  PT Assessment / Plan / Recommendation Clinical Impression  Pt s/p R TKR.  Pt would benefit from acute PT services in order to improve independence with transfers and ambulation in preparation for d/c to SNF.      PT Assessment  Patient needs continued PT services    Follow Up Recommendations  Skilled nursing facility    Barriers to Discharge        Equipment Recommendations  Defer to next venue    Recommendations for Other Services     Frequency 7X/week    Precautions / Restrictions Precautions Precautions: Knee Precaution Comments: d/c KI Restrictions Weight Bearing Restrictions: No RLE Weight Bearing: Weight bearing as tolerated   Pertinent Vitals/Pain 5/10 pain in R knee, repositioned, pt reports premedicated      Mobility  Bed Mobility Bed Mobility: Supine to Sit Supine to Sit: 4: Min guard;HOB elevated;With rails Details for Bed Mobility Assistance: verbal cues for technique Transfers Transfers: Stand to Sit;Sit to Stand Sit to Stand: 4: Min guard;With armrests Stand to Sit: To chair/3-in-1;4: Min assist Details for Transfer Assistance: verbal cues for safe technique, pt rushing to recliner after ambulation requiring cues to slow down and follow verbal cues for safety (due to feeling dizzy) Ambulation/Gait Ambulation/Gait Assistance: 4: Min guard Ambulation Distance (Feet): 60 Feet Assistive device: Rolling walker Ambulation/Gait Assistance Details: verbal cues for sequence, safety, and RW distance, pt reported dizziness with ambulation limiting distance, verbal cues for breathing (as pt reports she tends to hold her breathe) Gait Pattern: Step-to pattern;Antalgic Gait velocity: decreased General Gait Details: SaO2 98% room air and HR 58 bpm upon return to recliner, pt reported dizziness  resolved with reclining feet    Exercises     PT Diagnosis: Difficulty walking;Acute pain  PT Problem List: Decreased strength;Decreased range of motion;Decreased activity tolerance;Decreased mobility;Decreased knowledge of precautions;Decreased safety awareness;Decreased knowledge of use of DME;Pain PT Treatment Interventions: DME instruction;Gait training;Functional mobility training;Therapeutic activities;Therapeutic exercise;Patient/family education   PT Goals Acute Rehab PT Goals PT Goal Formulation: With patient Time For Goal Achievement: 04/25/12 Potential to Achieve Goals: Good Pt will go Supine/Side to Sit: with modified independence PT Goal: Supine/Side to Sit - Progress: Goal set today Pt will go Sit to Supine/Side: with modified independence PT Goal: Sit to Supine/Side - Progress: Goal set today Pt will go Sit to Stand: with supervision PT Goal: Sit to Stand - Progress: Goal set today Pt will go Stand to Sit: with supervision PT Goal: Stand to Sit - Progress: Goal set today Pt will Ambulate: 51 - 150 feet;with least restrictive assistive device;with supervision PT Goal: Ambulate - Progress: Goal set today Pt will Perform Home Exercise Program: with supervision, verbal cues required/provided PT Goal: Perform Home Exercise Program - Progress: Goal set today  Visit Information  Last PT Received On: 04/18/12 Assistance Needed: +1    Subjective Data  Subjective: Oh I've been wanting to get up.   Prior Functioning  Home Living Lives With: Alone Available Help at Discharge: Skilled Nursing Facility Additional Comments: Pt plans for d/c to SNF. Prior Function Level of Independence: Independent Communication Communication: No difficulties    Cognition  Overall Cognitive Status: Appears within functional limits for tasks assessed/performed Arousal/Alertness: Awake/alert Orientation Level: Appears intact for tasks assessed Behavior During Session: [facility] for tasks  performed    Extremity/Trunk Assessment Right Upper Extremity Assessment RUE  ROM/Strength/Tone: [facility] for tasks assessed Left Upper Extremity Assessment LUE ROM/Strength/Tone: WFL for tasks assessed Right Lower Extremity Assessment RLE ROM/Strength/Tone: Deficits RLE ROM/Strength/Tone Deficits: good quad contraction, moves R LE against gravity with KI Left Lower Extremity Assessment LLE ROM/Strength/Tone: WFL for tasks assessed   Balance    End of Session PT - End of Session Equipment Utilized During Treatment: Gait belt;Right knee immobilizer Activity Tolerance: Patient limited by fatigue Patient left: in chair;with call bell/phone within reach  GP     Alyssa Jacobs,Alyssa Jacobs 04/18/2012, 10:34 AM Pager: [phone]

## 2012-04-18 NOTE — Progress Notes (Signed)
Physical Therapy Treatment Note   04/18/12 1400  PT Visit Information  Last PT Received On 04/18/12  Assistance Needed +1  PT Time Calculation  PT Start Time 1322  PT Stop Time 1341  PT Time Calculation (min) 19 min  Subjective Data  Subjective It feels so much better.  (after exercises)  Precautions  Precautions Knee  Precaution Comments d/c KI  Restrictions  RLE Weight Bearing WBAT  Cognition  Overall Cognitive Status Appears within functional limits for tasks assessed/performed  Total Joint Exercises  Ankle Circles/Pumps AROM;Both;20 reps;Supine  Quad Sets AROM;Both;20 reps;Supine  Short Arc Quad AROM;Strengthening;Right;15 reps  Heel Slides AAROM;Right;20 reps;15 reps  Hip ABduction/ADduction AROM;Strengthening;Right;20 reps;Supine  Goniometric ROM R knee approx -8-65* with AAROM supine  PT - End of Session  Activity Tolerance Patient tolerated treatment well  Patient left in bed;with call bell/phone within reach  PT - Assessment/Plan  Comments on Treatment Session Pt reported feeling better after nap and did well with exercises.  Follow Up Recommendations Skilled nursing facility  Equipment Recommended Defer to next venue  Acute Rehab PT Goals  PT Goal: Perform Home Exercise Program - Progress Progressing toward goal  PT General Charges  $$ ACUTE PT VISIT 1 Procedure  PT Treatments  $Therapeutic Exercise 8-22 mins    Zenovia Jarred, PT Pager: [phone]

## 2012-04-18 NOTE — Progress Notes (Signed)
Clinical Social Work Department CLINICAL SOCIAL WORK PLACEMENT NOTE 04/18/2012  Patient:  Gartland,Lurae  Account Number:  [id number] Admit date:  04/17/2012  Clinical Social Worker:  [name], LCSW  Date/time:  04/18/2012 04:21 PM  Clinical Social Work is seeking post-discharge placement for this patient at the following level of care:   SKILLED NURSING   (*CSW will update this form in Epic as items are completed)   04/18/2012  Patient/family provided with [facility] Department of Clinical Social Work's list of facilities offering this level of care within the geographic area requested by the patient (or if unable, by the patient's family).  04/18/2012  Patient/family informed of their freedom to choose among providers that offer the needed level of care, that participate in Medicare, Medicaid or managed care program needed by the patient, have an available bed and are willing to accept the patient.    Patient/family informed of [facility]' ownership interest in [facility], as well as of the fact that they are under no obligation to receive care at this facility.  PASARR submitted to EDS on 04/18/2012 PASARR number received from EDS on 04/18/2012  FL2 transmitted to all facilities in geographic area requested by pt/family on  04/18/2012 FL2 transmitted to all facilities within larger geographic area on   Patient informed that his/her managed care company has contracts with or will negotiate with  certain facilities, including the following:     Patient/family informed of bed offers received:  04/18/2012 Patient chooses bed at  Physician recommends and patient chooses bed at    Patient to be transferred to  on   Patient to be transferred to facility by   The following physician request were entered in Epic:   Additional Comments:  [name] LCSW [phone]

## 2012-04-18 NOTE — Progress Notes (Signed)
   Subjective: 1 Day Post-Op Procedure(s) (LRB): TOTAL KNEE ARTHROPLASTY (Right) Patient reports pain as mild.  She had a good night. Patient seen in rounds with Dr. Lequita Halt. Patient is well, and has had no acute complaints or problems We will start therapy today.  Plan is to go home versus possible short rehab stay depending upon insurance after hospital stay.  Objective: Vital signs in last 24 hours: Temp:  [97.3 F (36.3 C)-98.2 F (36.8 C)] 97.9 F (36.6 C) (07/30 0506) Pulse Rate:  [61-83] 83  (07/30 0506) Resp:  [12-18] 16  (07/30 0506) BP: (109-136)/(51-70) 115/70 mmHg (07/30 0506) SpO2:  [97 %-100 %] 97 % (07/30 0506) FiO2 (%):  [100 %] 100 % (07/29 1730) Weight:  [77.565 kg (171 lb)] 77.565 kg (171 lb) (07/29 1730)  Intake/Output from previous day:  Intake/Output Summary (Last 24 hours) at 04/18/12 0752 Last data filed at 04/18/12 0507  Gross per 24 hour  Intake 4142.5 ml  Output   2550 ml  Net 1592.5 ml    Intake/Output this shift: UOP 250  Labs:  [facility] 04/18/12 0425  HGB 11.6*    Basename 04/18/12 0425  WBC 8.7  RBC 4.17  HCT 36.8  PLT 213    Basename 04/18/12 0425  NA 134*  K 4.6  CL 102  CO2 24  BUN 9  CREATININE 0.56  GLUCOSE 154*  CALCIUM 8.6   No results found for this basename: LABPT:2,INR:2 in the last 72 hours  EXAM General - Patient is Alert, Appropriate and Oriented Extremity - Neurovascular intact Sensation intact distally Dorsiflexion/Plantar flexion intact Dressing - dressing C/D/I Motor Function - intact, moving foot and toes well on exam.  Hemovac pulled without difficulty.  Past Medical History  Diagnosis Date  . Arthritis   . Psoriasis   . GERD (gastroesophageal reflux disease)     no meds currently  . Nocturia   . Frequency of urination     Assessment/Plan: 1 Day Post-Op Procedure(s) (LRB): TOTAL KNEE ARTHROPLASTY (Right) Principal Problem:  *OA (osteoarthritis) of knee   Advance diet Up with  therapy  DVT Prophylaxis - Xarelto Weight-Bearing as tolerated to right leg No vaccines. D/C O2 and Pulse OX and try on Room Air  Anuhea Gassner, Marlowe Sax 04/18/2012, 7:52 AM

## 2012-04-19 ENCOUNTER — Encounter (HOSPITAL_COMMUNITY): Payer: Self-pay | Admitting: Orthopedic Surgery

## 2012-04-19 LAB — BASIC METABOLIC PANEL
BUN: 9 mg/dL (ref 6–23)
Chloride: 97 mEq/L (ref 96–112)
GFR calc Af Amer: >90 mL/min (ref >90)
Potassium: 3.7 mEq/L (ref 3.5–5.1)

## 2012-04-19 LAB — CBC
HCT: 32.9 % — ABNORMAL LOW (ref 36.0–46.0)
RDW: 13.1 % (ref 11.5–15.5)
WBC: 12.2 10*3/uL — ABNORMAL HIGH (ref 4.0–10.5)

## 2012-04-19 NOTE — Evaluation (Signed)
Occupational Therapy Evaluation Patient Details Name: Alyssa Jacobs MRN: [id number] DOB: [birth year] Today's Date: 04/19/2012 Time: 4098-1191 OT Time Calculation (min): 19 min  OT Assessment / Plan / Recommendation Clinical Impression  Pt presents with a RTKR POD 2. Pt lives alone and would need 24/7 A at home. Skilled OT indicated to maximize independence with BADLs to supervision level in prep for safe d/c to next venue of care.    OT Assessment  Patient needs continued OT Services    Follow Up Recommendations  Skilled nursing facility    Barriers to Discharge Inaccessible home environment;Decreased caregiver support    Equipment Recommendations  Defer to next venue    Recommendations for Other Services    Frequency  Min 2X/week    Precautions / Restrictions Precautions Precautions: Knee Restrictions Weight Bearing Restrictions: No RLE Weight Bearing: Weight bearing as tolerated   Pertinent Vitals/Pain Reported 7-8/10 pain. Repositoned and cold applied.    ADL  Grooming: Simulated;Set up Where Assessed - Grooming: Unsupported sitting Upper Body Bathing: Simulated;Set up Where Assessed - Upper Body Bathing: Unsupported sitting Lower Body Bathing: Simulated;Minimal assistance Where Assessed - Lower Body Bathing: Supported sit to stand Upper Body Dressing: Simulated;Set up Where Assessed - Upper Body Dressing: Unsupported sitting Lower Body Dressing: Simulated;Moderate assistance Where Assessed - Lower Body Dressing: Supported sit to Pharmacist, hospital: Performed;Minimal Dentist Method: Surveyor, minerals: Materials engineer and Hygiene: Performed;Minimal assistance Where Assessed - Engineer, mining and Hygiene: Sit to stand from 3-in-1 or toilet Equipment Used: Rolling walker;Other (comment) (3:1) Transfers/Ambulation Related to ADLs: Pt limited this session by pain. Only agreeable  to transfer from bed>BSC>chair.    OT Diagnosis: Generalized weakness  OT Problem List: Decreased activity tolerance;Decreased safety awareness;Decreased knowledge of use of DME or AE;Pain OT Treatment Interventions: Self-care/ADL training;Therapeutic activities;DME and/or AE instruction;Patient/family education   OT Goals Acute Rehab OT Goals OT Goal Formulation: With patient Time For Goal Achievement: 04/26/12 Potential to Achieve Goals: Good ADL Goals Pt Will Perform Grooming: with supervision;Standing at sink ADL Goal: Grooming - Progress: Goal set today Pt Will Perform Lower Body Bathing: with supervision;Sit to stand from chair;Sit to stand from bed ADL Goal: Lower Body Bathing - Progress: Goal set today Pt Will Perform Lower Body Dressing: with supervision;Sit to stand from bed;Sit to stand from chair ADL Goal: Lower Body Dressing - Progress: Goal set today Pt Will Transfer to Toilet: with supervision;Comfort height toilet;3-in-1;Ambulation ADL Goal: Toilet Transfer - Progress: Goal set today Pt Will Perform Toileting - Clothing Manipulation: with supervision;Standing ADL Goal: Toileting - Clothing Manipulation - Progress: Goal set today Pt Will Perform Toileting - Hygiene: with supervision;Sit to stand from 3-in-1/toilet ADL Goal: Toileting - Hygiene - Progress: Goal set today  Visit Information  Last OT Received On: 04/19/12 Assistance Needed: +1    Subjective Data  Subjective: I really need to use the bathroom. Patient Stated Goal: I want to be dancing soon.   Prior Functioning  Vision/Perception  Home Living Lives With: Alone Available Help at Discharge: Family Type of Home: House Home Access: Stairs to enter Secretary/administrator of Steps: 4 Home Layout: Two level;1/2 bath on main level Alternate Level Stairs-Number of Steps: 14 Alternate Level Stairs-Rails: Left Bathroom Shower/Tub: Walk-in shower;Tub/shower unit (tub on 1st floor, shower on 2nd) Home  Adaptive Equipment: Walker - rolling;Straight cane Prior Function Level of Independence: Independent Able to Take Stairs?: Yes Driving: Yes Vocation: Full time employment Communication Communication: No difficulties  Cognition  Overall Cognitive Status: Appears within functional limits for tasks assessed/performed Arousal/Alertness: Awake/alert Orientation Level: Appears intact for tasks assessed Behavior During Session: [facility] for tasks performed    Extremity/Trunk Assessment Right Upper Extremity Assessment RUE ROM/Strength/Tone: [facility] for tasks assessed Left Upper Extremity Assessment LUE ROM/Strength/Tone: WFL for tasks assessed   Mobility Bed Mobility Supine to Sit: 4: Min guard;HOB flat;With rails Details for Bed Mobility Assistance: min VCs for technique and hand placement. Transfers Sit to Stand: 4: Min guard;4: Min assist;With upper extremity assist;From bed;From chair/3-in-1 Stand to Sit: 4: Min guard;With upper extremity assist;With armrests;To chair/3-in-1 Details for Transfer Assistance: Min VCs for hand placement, technique and LE management.   Exercise    Balance    End of Session OT - End of Session Activity Tolerance: Patient limited by pain;Patient limited by fatigue Patient left: in chair;with call bell/phone within reach  GO     Harnoor Kohles A OTR/L [phone] 04/19/2012, 10:03 AM

## 2012-04-19 NOTE — Progress Notes (Signed)
Physical Therapy Treatment Patient Details Name: Alyssa Jacobs MRN: [id number] DOB: [birth year] Today's Date: 04/19/2012 Time: 1350-1405 PT Time Calculation (min): 15 min  PT Assessment / Plan / Recommendation Comments on Treatment Session  Ther ex performed in bed. Assisted pt to [facility]. Pt moves quickly, verbal cues required for safety and to maintain contact with RW.     Follow Up Recommendations  Skilled nursing facility    Barriers to Discharge        Equipment Recommendations  Defer to next venue    Recommendations for Other Services OT consult  Frequency 7X/week   Plan Discharge plan remains appropriate    Precautions / Restrictions Precautions Precautions: Knee Restrictions Weight Bearing Restrictions: No RLE Weight Bearing: Weight bearing as tolerated   Pertinent Vitals/Pain *7/10 R knee, pain meds requested, ice applied**    Mobility  Bed Mobility Supine to Sit: 4: Min assist;HOB elevated Details for Bed Mobility Assistance: min A for RLE Transfers Transfers: Stand to Sit;Sit to Stand;Stand Pivot Transfers Sit to Stand: 4: Min guard;With armrests;From chair/3-in-1;From bed Stand to Sit: 4: Min guard;With upper extremity assist;With armrests;To chair/3-in-1;To toilet Stand Pivot Transfers: 4: Min guard Details for Transfer Assistance: verbal cues for safe technique, VCs hand placement    Exercises Total Joint Exercises Ankle Circles/Pumps: AROM;Both;20 reps;Supine Quad Sets: AROM;Both;20 reps;Supine Towel Squeeze: AROM;Both;15 reps;Supine Short Arc Quad: AROM;Strengthening;Right;20 reps Heel Slides: AAROM;Right;15 reps Hip ABduction/ADduction: AROM;Strengthening;Right;20 reps;Supine Straight Leg Raises: AAROM;Right;10 reps;Supine   PT Diagnosis:    PT Problem List:   PT Treatment Interventions:     PT Goals Acute Rehab PT Goals PT Goal: Perform Home Exercise Program - Progress: Progressing toward goal  Visit Information  Last PT Received On:  04/19/12 Assistance Needed: +1    Subjective Data  Subjective: I need to use the bathroom.  Patient Stated Goal: to walk more   Cognition  Overall Cognitive Status: Appears within functional limits for tasks assessed/performed Arousal/Alertness: Awake/alert Orientation Level: Appears intact for tasks assessed Behavior During Session: [facility] for tasks performed    Balance     End of Session PT - End of Session Equipment Utilized During Treatment: Gait belt Activity Tolerance: Patient tolerated treatment well Patient left: with call bell/phone within reach;in bed CPM Right Knee CPM Right Knee: Off   GP     Ralene Bathe Kistler 04/19/2012, 2:13 PM

## 2012-04-19 NOTE — Progress Notes (Signed)
Physical Therapy Treatment Patient Details Name: Alyssa Jacobs MRN: [id number] DOB: April 08, 1941 Today's Date: 04/19/2012 Time: 2725-3664 PT Time Calculation (min): 14 min  PT Assessment / Plan / Recommendation Comments on Treatment Session  Pt able to increase ambulation distance a little this morning.    Follow Up Recommendations  Skilled nursing facility    Barriers to Discharge        Equipment Recommendations  Defer to next venue    Recommendations for Other Services    Frequency     Plan      Precautions / Restrictions Precautions Precautions: Knee Restrictions Weight Bearing Restrictions: No RLE Weight Bearing: Weight bearing as tolerated   Pertinent Vitals/Pain 6/10 R knee pain, ice applied, premedicated    Mobility  Bed Mobility Supine to Sit: 4: Min guard;HOB flat;With rails Details for Bed Mobility Assistance: min VCs for technique and hand placement. Transfers Transfers: Stand to Sit;Sit to Stand Sit to Stand: 4: Min guard;With armrests;From chair/3-in-1 Stand to Sit: 4: Min guard;With upper extremity assist;With armrests;To chair/3-in-1 Details for Transfer Assistance: verbal cues for safe technique, especially upon sitting in recliner as pt reported dizziness and attempting to reach back before completely backing up to chair, BP 120/62 and HR 81 upon sitting in recliner Ambulation/Gait Ambulation/Gait Assistance: 4: Min guard Ambulation Distance (Feet): 80 Feet Assistive device: Rolling walker Ambulation/Gait Assistance Details: verbal cues for sequence and safety, moderate cues for safe RW distance, pt reported increased dizziness upon returning to room but felt she could safely ambulate to recliner Gait Pattern: Step-through pattern;Antalgic Gait velocity: decreased    Exercises     PT Diagnosis:    PT Problem List:   PT Treatment Interventions:     PT Goals Acute Rehab PT Goals PT Goal: Sit to Stand - Progress: Progressing toward goal PT Goal:  Stand to Sit - Progress: Progressing toward goal PT Goal: Ambulate - Progress: Progressing toward goal  Visit Information  Last PT Received On: 04/19/12 Assistance Needed: +1    Subjective Data  Subjective: I could not tolerate that CPM last night.  It hurt so much.   Cognition  Overall Cognitive Status: Appears within functional limits for tasks assessed/performed Arousal/Alertness: Awake/alert Orientation Level: Appears intact for tasks assessed Behavior During Session: [facility] for tasks performed    Balance     End of Session PT - End of Session Equipment Utilized During Treatment: Gait belt;Right knee immobilizer Activity Tolerance: Patient tolerated treatment well Patient left: with call bell/phone within reach;in chair   GP     Grisela Mesch,KATHrine E 04/19/2012, 10:42 AM Pager: [phone]

## 2012-04-20 DIAGNOSIS — E871 Hypo-osmolality and hyponatremia: Secondary | ICD-10-CM

## 2012-04-20 LAB — CBC
HCT: 30.4 % — ABNORMAL LOW (ref 36.0–46.0)
Hemoglobin: 10 g/dL — ABNORMAL LOW (ref 12.0–15.0)
MCHC: 32.9 g/dL (ref 30.0–36.0)
WBC: 8.1 10*3/uL (ref 4.0–10.5)

## 2012-04-20 MED ORDER — METHOCARBAMOL 500 MG PO TABS: 500.0000 mg | ORAL_TABLET | Freq: Four times a day (QID) | ORAL | Status: AC | PRN

## 2012-04-20 MED ORDER — ONDANSETRON HCL 4 MG PO TABS: 4.0000 mg | ORAL_TABLET | Freq: Four times a day (QID) | ORAL | Status: AC | PRN

## 2012-04-20 MED ORDER — POLYETHYLENE GLYCOL 3350 17 G PO PACK: 17.0000 g | PACK | Freq: Every day | ORAL | Status: AC | PRN

## 2012-04-20 MED ORDER — BISACODYL 10 MG RE SUPP: 10.0000 mg | Freq: Every day | RECTAL | Status: AC | PRN

## 2012-04-20 MED ORDER — DSS 100 MG PO CAPS: 100.0000 mg | ORAL_CAPSULE | Freq: Two times a day (BID) | ORAL | Status: AC

## 2012-04-20 MED ORDER — RIVAROXABAN 10 MG PO TABS: 10.0000 mg | ORAL_TABLET | Freq: Every day | ORAL | Status: DC

## 2012-04-20 MED ORDER — METHOCARBAMOL 500 MG PO TABS: 500.0000 mg | ORAL_TABLET | Freq: Four times a day (QID) | ORAL | Status: DC | PRN

## 2012-04-20 MED ORDER — TRAMADOL HCL 50 MG PO TABS: 50.0000 mg | ORAL_TABLET | Freq: Four times a day (QID) | ORAL | Status: AC | PRN

## 2012-04-20 MED ORDER — ACETAMINOPHEN 325 MG PO TABS: 650.0000 mg | ORAL_TABLET | Freq: Four times a day (QID) | ORAL | Status: AC | PRN

## 2012-04-20 MED ORDER — HYDROMORPHONE HCL 2 MG PO TABS: 2.0000 mg | ORAL_TABLET | ORAL | Status: AC | PRN

## 2012-04-20 MED ORDER — HYDROMORPHONE HCL 2 MG PO TABS: 2.0000 mg | ORAL_TABLET | ORAL | Status: DC | PRN

## 2012-04-20 NOTE — Progress Notes (Signed)
Clinical Social Work Department CLINICAL SOCIAL WORK PLACEMENT NOTE 04/20/2012  Patient:  Alyssa Jacobs,Alyssa Jacobs  Account Number:  [id number] Admit date:  04/17/2012  Clinical Social Worker:  [name], LCSW  Date/time:  04/18/2012 04:21 PM  Clinical Social Work is seeking post-discharge placement for this patient at the following level of care:   SKILLED NURSING   (*CSW will update this form in Epic as items are completed)   04/18/2012  Patient/family provided with [facility] Department of Clinical Social Work's list of facilities offering this level of care within the geographic area requested by the patient (or if unable, by the patient's family).  04/18/2012  Patient/family informed of their freedom to choose among providers that offer the needed level of care, that participate in Medicare, Medicaid or managed care program needed by the patient, have an available bed and are willing to accept the patient.    Patient/family informed of [facility]' ownership interest in [facility], as well as of the fact that they are under no obligation to receive care at this facility.  PASARR submitted to EDS on 04/18/2012 PASARR number received from EDS on 04/18/2012  FL2 transmitted to all facilities in geographic area requested by pt/family on  04/18/2012 FL2 transmitted to all facilities within larger geographic area on   Patient informed that his/her managed care company has contracts with or will negotiate with  certain facilities, including the following:     Patient/family informed of bed offers received:  04/18/2012 Patient chooses bed at [facility] Physician recommends and patient chooses bed at    Patient to be transferred to [facility] on  04/20/2012 Patient to be transferred to facility by [facility]  The following physician request were entered in Epic:   Additional Comments:  [name] LCSW

## 2012-04-20 NOTE — Progress Notes (Signed)
CSW assisting with SNF placement. Daughter toured several facilities 7/31. Pt/daughter will decide this am which SNF they have chosen for ST Rehab. [employer] has provided prior approval for in network SNF placement. CSW will continue to follow to assist with d/c planning to SNF.  [name] LCSW [phone]

## 2012-04-20 NOTE — Progress Notes (Signed)
Physical Therapy Treatment Patient Details Name: Alyssa Jacobs MRN: [id number] DOB: 11-26-1940 Today's Date: 04/20/2012 Time: 4098-1191 PT Time Calculation (min): 25 min  PT Assessment / Plan / Recommendation Comments on Treatment Session  Pt plans to D/C to [facility]    Follow Up Recommendations  Skilled nursing facility    Barriers to Discharge        Equipment Recommendations  Defer to next venue    Recommendations for Other Services    Frequency 7X/week   Plan Discharge plan remains appropriate    Precautions / Restrictions Precautions Precautions: Knee Precaution Comments: D/C KI, pt able to perform 10 active SLR Restrictions Weight Bearing Restrictions: No RLE Weight Bearing: Weight bearing as tolerated    Pertinent Vitals/Pain C/o 3/10 after TE's ICE applied    Mobility  Bed Mobility Bed Mobility: Sit to Supine Sit to Supine: 4: Min guard Details for Bed Mobility Assistance: assisted pt back to bed for TE's  Transfers Transfers: Sit to Stand;Stand to Sit Sit to Stand: 4: Min guard;From chair/3-in-1 Stand to Sit: 4: Min guard;To chair/3-in-1 Details for Transfer Assistance: 25% VC's for safety as pt is impulsive  Ambulation/Gait Ambulation/Gait Assistance: 4: Min guard Ambulation Distance (Feet): 125 Feet Assistive device: Rolling walker Ambulation/Gait Assistance Details: 25% VC's to increase posture and proper walker to self distance Gait Pattern: Step-to pattern;Decreased stance time - right    Exercises Total Joint Exercises Ankle Circles/Pumps: AROM;Both;10 reps;Supine Quad Sets: AROM;Both;10 reps;Supine Gluteal Sets: AROM;Both;10 reps;Supine Towel Squeeze: AROM;Both;10 reps;Supine Short Arc Quad: AROM;Right;10 reps;Supine Heel Slides: AAROM;Right;10 reps;Supine Hip ABduction/ADduction: AROM;Right;10 reps;Supine Straight Leg Raises: AROM;Right;10 reps;Supine   PT Goals                             progressing    Visit  Information  Last PT Received On: 04/20/12 Assistance Needed: +1                   End of Session PT - End of Session Equipment Utilized During Treatment: Gait belt Activity Tolerance: Patient tolerated treatment well Patient left: in bed;with call bell/phone within reach   Felecia Shelling  PTA [facility]  Acute  Rehab Pager     [phone]

## 2012-04-20 NOTE — Progress Notes (Signed)
   Subjective: 3 Days Post-Op Procedure(s) (LRB): TOTAL KNEE ARTHROPLASTY (Right) Patient reports pain as mild.   Patient seen in rounds with Dr. Lequita Halt. Patient is well, and has had no acute complaints or problems Patient is ready to go to skilled facility.  Objective: Vital signs in last 24 hours: Temp:  [98.5 F (36.9 C)-99.8 F (37.7 C)] 98.5 F (36.9 C) (08/01 0532) Pulse Rate:  [74-88] 74  (08/01 0532) Resp:  [16] 16  (08/01 0532) BP: (121-125)/(61-71) 125/71 mmHg (08/01 0532) SpO2:  [96 %-97 %] 97 % (08/01 0532)  Intake/Output from previous day:  Intake/Output Summary (Last 24 hours) at 04/20/12 1014 Last data filed at 04/20/12 0900  Gross per 24 hour  Intake    840 ml  Output    400 ml  Net    440 ml    Intake/Output this shift: Total I/O In: 360 [P.O.:360] Out: -   Labs:  Basename 04/20/12 0419 04/19/12 0401 04/18/12 0425  HGB 10.0* 10.8* 11.6*    Basename 04/20/12 0419 04/19/12 0401  WBC 8.1 12.2*  RBC 3.44* 3.74*  HCT 30.4* 32.9*  PLT 179 179    Basename 04/19/12 0401 04/18/12 0425  NA 130* 134*  K 3.7 4.6  CL 97 102  CO2 23 24  BUN 9 9  CREATININE 0.59 0.56  GLUCOSE 120* 154*  CALCIUM 8.6 8.6   No results found for this basename: LABPT:2,INR:2 in the last 72 hours  EXAM: General - Patient is Alert, Appropriate and Oriented Extremity - Neurovascular intact Sensation intact distally Dorsiflexion/Plantar flexion intact Incision - clean, dry, no drainage, healing Motor Function - intact, moving foot and toes well on exam.   Assessment/Plan: 3 Days Post-Op Procedure(s) (LRB): TOTAL KNEE ARTHROPLASTY (Right) Procedure(s) (LRB): TOTAL KNEE ARTHROPLASTY (Right) Past Medical History  Diagnosis Date  . Arthritis   . Psoriasis   . GERD (gastroesophageal reflux disease)     no meds currently  . Nocturia   . Frequency of urination    Principal Problem:  *OA (osteoarthritis) of knee Active Problems:  Postop Hyponatremia Fluids have  been stopped and will allow the Na+ to concentrate back up on its own.  Discharge to SNF Diet - Regular diet Follow up - in 2 weeks Activity - WBAT Disposition - Skilled nursing facility Condition Upon Discharge - Good D/C Meds - See DC Summary DVT Prophylaxis - Xarelto  Jacy Howat 04/20/2012, 10:14 AM

## 2012-04-20 NOTE — Plan of Care (Signed)
Problem: Consults Goal: Diagnosis- Total Joint Replacement Outcome: Completed/Met Date Met:  04/20/12 Primary Total Knee RIGHT

## 2012-04-20 NOTE — Discharge Summary (Signed)
Physician Discharge Summary   Patient ID: Alyssa Jacobs MRN: [id number] DOB/AGE: 70-01-1941 70 y.o.  Admit date: 04/17/2012 Discharge date: 04/20/2012  Primary Diagnosis: Osteoarthritis Right knee   Admission Diagnoses:  Past Medical History  Diagnosis Date  . Arthritis   . Psoriasis   . GERD (gastroesophageal reflux disease)     no meds currently  . Nocturia   . Frequency of urination    Discharge Diagnoses:   Principal Problem:  *OA (osteoarthritis) of knee Active Problems:  Postop Hyponatremia  Procedure:  Procedure(s) (LRB): TOTAL KNEE ARTHROPLASTY (Right)   Consults: None  HPI: Alyssa Jacobs is a 71 y.o. year old female with end stage OA of her right knee with progressively worsening pain and dysfunction. She has constant pain, with activity and at rest and significant functional deficits with difficulties even with ADLs. She has had extensive non-op management including analgesics, injections of cortisone and viscosupplements, and home exercise program, but remains in significant pain with significant dysfunction. Radiographs show bone on bone arthritis medial and patellofemoral. She presents now for right Total Knee Arthroplasty.    Laboratory Data: Hospital Outpatient Visit on 04/10/2012  Component Date Value Range Status  . MRSA, PCR 04/10/2012 NEGATIVE  NEGATIVE Final  . Staphylococcus aureus 04/10/2012 NEGATIVE  NEGATIVE Final   Comment:                                 The Xpert SA Assay (FDA                          approved for NASAL specimens                          only), is one component of                          a comprehensive surveillance                          program.  It is not intended                          to diagnose infection nor to                          guide or monitor treatment.  Marland Kitchen aPTT 04/10/2012 30  24 - 37 seconds Final  . WBC 04/10/2012 6.3  4.0 - 10.5 K/uL Final  . RBC 04/10/2012 4.87  3.87 - 5.11 MIL/uL Final  .  Hemoglobin 04/10/2012 13.7  12.0 - 15.0 g/dL Final  . HCT 28/41/3244 43.2  36.0 - 46.0 % Final  . MCV 04/10/2012 88.7  78.0 - 100.0 fL Final  . MCH 04/10/2012 28.1  26.0 - 34.0 pg Final  . MCHC 04/10/2012 31.7  30.0 - 36.0 g/dL Final  . RDW 09/22/7251 13.0  11.5 - 15.5 % Final  . Platelets 04/10/2012 264  150 - 400 K/uL Final  . Sodium 04/10/2012 137  135 - 145 mEq/L Final  . Potassium 04/10/2012 4.4  3.5 - 5.1 mEq/L Final  . Chloride 04/10/2012 102  96 - 112 mEq/L Final  . CO2 04/10/2012 26  19 - 32 mEq/L Final  . Glucose, Bld 04/10/2012 99  70 -  99 mg/dL Final  . BUN 16/06/9603 11  6 - 23 mg/dL Final  . Creatinine, Ser 04/10/2012 0.70  0.50 - 1.10 mg/dL Final  . Calcium 54/05/8118 9.2  8.4 - 10.5 mg/dL Final  . Total Protein 04/10/2012 7.1  6.0 - 8.3 g/dL Final  . Albumin 14/78/2956 3.7  3.5 - 5.2 g/dL Final  . AST 21/30/8657 16  0 - 37 U/L Final  . ALT 04/10/2012 15  0 - 35 U/L Final  . Alkaline Phosphatase 04/10/2012 89  39 - 117 U/L Final  . Total Bilirubin 04/10/2012 0.3  0.3 - 1.2 mg/dL Final  . GFR calc non Af Amer 04/10/2012 86* >90 mL/min Final  . GFR calc Af Amer 04/10/2012 >90  >90 mL/min Final   Comment:                                 The eGFR has been calculated                          using the CKD EPI equation.                          This calculation has not been                          validated in all clinical                          situations.                          eGFR's persistently                          <90 mL/min signify                          possible Chronic Kidney Disease.  Marland Kitchen Prothrombin Time 04/10/2012 12.6  11.6 - 15.2 seconds Final  . INR 04/10/2012 0.92  0.00 - 1.49 Final  . Color, Urine 04/10/2012 YELLOW  YELLOW Final  . APPearance 04/10/2012 CLEAR  CLEAR Final  . Specific Gravity, Urine 04/10/2012 1.009  1.005 - 1.030 Final  . pH 04/10/2012 6.5  5.0 - 8.0 Final  . Glucose, UA 04/10/2012 NEGATIVE  NEGATIVE mg/dL Final  . Hgb urine  dipstick 04/10/2012 NEGATIVE  NEGATIVE Final  . Bilirubin Urine 04/10/2012 NEGATIVE  NEGATIVE Final  . Ketones, ur 04/10/2012 NEGATIVE  NEGATIVE mg/dL Final  . Protein, ur 84/69/6295 NEGATIVE  NEGATIVE mg/dL Final  . Urobilinogen, UA 04/10/2012 0.2  0.0 - 1.0 mg/dL Final  . Nitrite 28/41/3244 NEGATIVE  NEGATIVE Final  . Leukocytes, UA 04/10/2012 NEGATIVE  NEGATIVE Final   MICROSCOPIC NOT DONE ON URINES WITH NEGATIVE PROTEIN, BLOOD, LEUKOCYTES, NITRITE, OR GLUCOSE <1000 mg/dL.    Basename 04/20/12 0419 04/19/12 0401 04/18/12 0425  HGB 10.0* 10.8* 11.6*    Basename 04/20/12 0419 04/19/12 0401  WBC 8.1 12.2*  RBC 3.44* 3.74*  HCT 30.4* 32.9*  PLT 179 179    Basename 04/19/12 0401 04/18/12 0425  NA 130* 134*  K 3.7 4.6  CL 97 102  CO2 23 24  BUN 9 9  CREATININE 0.59 0.56  GLUCOSE 120*  154*  CALCIUM 8.6 8.6   No results found for this basename: LABPT:2,INR:2 in the last 72 hours  X-Rays:No results found.  EKG:No orders found for this or any previous visit.   Hospital Course: Patient was admitted to [facility] and taken to the OR and underwent the above state procedure without complications.  Patient tolerated the procedure well and was later transferred to the recovery room and then to the orthopaedic floor for postoperative care.  They were given PO and IV analgesics for pain control following their surgery.  They were given 24 hours of postoperative antibiotics and started on DVT prophylaxis in the form of Xarelto.   PT and OT were ordered for total joint protocol.  Discharge planning consulted to help with postop disposition and equipment needs.  Patient had a good night on the evening of surgery and started to get up OOB with therapy on day one.  Hemovac drain was pulled without difficulty.  Continued to work with therapy into day two.  Dressing was changed on day two and the incision was healing well.  By day three, the patient had progressed with therapy and meeting  their goals. Fluids have been stopped and will allow the Na+ to concentrate back up on its own.  Incision was healing well.  Patient was seen in rounds and was ready to go home.  Discharge Medications: Prior to Admission medications   Medication Sig Start Date End Date Taking? Authorizing Provider  acetaminophen (TYLENOL) 325 MG tablet Take 2 tablets (650 mg total) by mouth every 6 (six) hours as needed (or Fever >/= 101). 04/20/12 04/20/13  Alexzandrew Perkins, PA  bisacodyl (DULCOLAX) 10 MG suppository Place 1 suppository (10 mg total) rectally daily as needed. 04/20/12 04/30/12  Alexzandrew Perkins, PA  docusate sodium 100 MG CAPS Take 100 mg by mouth 2 (two) times daily. 04/20/12 04/30/12  Alexzandrew Perkins, PA  HYDROmorphone (DILAUDID) 2 MG tablet Take 1-2 tablets (2-4 mg total) by mouth every 4 (four) hours as needed. 04/20/12 04/30/12  Alexzandrew Perkins, PA  methocarbamol (ROBAXIN) 500 MG tablet Take 1 tablet (500 mg total) by mouth every 6 (six) hours as needed. 04/20/12 04/30/12  Alexzandrew Perkins, PA  ondansetron (ZOFRAN) 4 MG tablet Take 1 tablet (4 mg total) by mouth every 6 (six) hours as needed for nausea. 04/20/12 04/27/12  Alexzandrew Perkins, PA  polyethylene glycol (MIRALAX / GLYCOLAX) packet Take 17 g by mouth daily as needed. 04/20/12 04/23/12  Alexzandrew Julien Girt, PA  rivaroxaban (XARELTO) 10 MG TABS tablet Take 1 tablet (10 mg total) by mouth daily with breakfast. Take Xarelto for two and a half more weeks, then discontinue Xarelto. 04/20/12   Alexzandrew Julien Girt, PA  traMADol (ULTRAM) 50 MG tablet Take 1-2 tablets (50-100 mg total) by mouth every 6 (six) hours as needed. 04/20/12 04/30/12  Alexzandrew Julien Girt, PA    Diet: Regular diet Activity:WBAT Follow-up:in 2 weeks Disposition - [facility] facility - [facility] Dis[facility] Condition: good   Discharge Orders    Future Orders Please Complete By Expires   Diet general      Call MD / Call 911      Comments:   If you experience chest  pain or shortness of breath, CALL 911 and be transported to the hospital emergency room.  If you develope a fever above 101 F, pus (white drainage) or increased drainage or redness at the wound, or calf pain, call your surgeon's office.   Discharge instructions  Comments:   Pick up stool softner and laxative for home. Do not submerge incision under water. May shower. Continue to use ice for pain and swelling from surgery.  Take Xarelto for two and a half more weeks, then discontinue Xarelto.  When discharged from the skilled rehab facility, please have the facility set up the patient's Home Health Physical Therapy prior to being released.  Also provide the patient with their medications at time of release from the facility to include their pain medication, the muscle relaxants, and their blood thinner medication.  If the patient is still at the rehab facility at time of follow up appointment, please also assist the patient in arranging follow up appointment in our office and any transportation needs.   Constipation Prevention      Comments:   Drink plenty of fluids.  Prune juice may be helpful.  You may use a stool softener, such as Colace (over the counter) 100 mg twice a day.  Use MiraLax (over the counter) for constipation as needed.   Increase activity slowly as tolerated      Driving restrictions      Comments:   No driving until released by the physician.   Lifting restrictions      Comments:   No lifting until released by the physician.   TED hose      Comments:   Use stockings (TED hose) for 3 weeks on both leg(s).  You may remove them at night for sleeping.   Change dressing      Comments:   Change dressing daily with sterile 4 x 4 inch gauze dressing and apply TED hose. Do not submerge the incision under water.   Do not put a pillow under the knee. Place it under the heel.      Do not sit on low chairs, stoools or toilet seats, as it may be difficult to get up from low  surfaces      Patient may shower      Comments:   You may shower without a dressing once there is no drainage.  Do not wash over the wound.  If drainage remains, do not shower until drainage stops.     Medication List  As of 04/20/2012 10:24 AM   STOP taking these medications         cholecalciferol 1000 UNITS tablet      multivitamin with minerals Tabs      naproxen sodium 220 MG tablet         TAKE these medications         acetaminophen 325 MG tablet   Commonly known as: TYLENOL   Take 2 tablets (650 mg total) by mouth every 6 (six) hours as needed (or Fever >/= 101).      bisacodyl 10 MG suppository   Commonly known as: DULCOLAX   Place 1 suppository (10 mg total) rectally daily as needed.      DSS 100 MG Caps   Take 100 mg by mouth 2 (two) times daily.      HYDROmorphone 2 MG tablet   Commonly known as: DILAUDID   Take 1-2 tablets (2-4 mg total) by mouth every 4 (four) hours as needed.      methocarbamol 500 MG tablet   Commonly known as: ROBAXIN   Take 1 tablet (500 mg total) by mouth every 6 (six) hours as needed.      ondansetron 4 MG tablet   Commonly known as: ZOFRAN  Take 1 tablet (4 mg total) by mouth every 6 (six) hours as needed for nausea.      polyethylene glycol packet   Commonly known as: MIRALAX / GLYCOLAX   Take 17 g by mouth daily as needed.      rivaroxaban 10 MG Tabs tablet   Commonly known as: XARELTO   Take 1 tablet (10 mg total) by mouth daily with breakfast. Take Xarelto for two and a half more weeks, then discontinue Xarelto.      traMADol 50 MG tablet   Commonly known as: ULTRAM   Take 1-2 tablets (50-100 mg total) by mouth every 6 (six) hours as needed.           Follow-up Information    Follow up with Loanne Drilling, MD. Schedule an appointment as soon as possible for a visit in 2 weeks.   Contact information:   [facility] [address] [place] [address] [phone]            Signed: Patrica Duel 04/20/2012, 10:24 AM

## 2012-04-20 NOTE — Progress Notes (Signed)
  LATE ENTRY NOTE Date of Service of Visit - 04/19/2012  Subjective: 2 Days Post-Op Procedure(s) (LRB): TOTAL KNEE ARTHROPLASTY (Right) Patient reports pain as mild and moderate.   Patient seen in rounds with Dr. Lequita Halt. Patient is well, and has had no acute complaints or problems Plan is to go Skilled nursing facility after hospital stay.  Objective: Vital signs in last 24 hours: Temp:  [98.5 F (36.9 C)-99.8 F (37.7 C)] 98.5 F (36.9 C) (08/01 0532) Pulse Rate:  [74-88] 74  (08/01 0532) Resp:  [16] 16  (08/01 0532) BP: (121-125)/(61-71) 125/71 mmHg (08/01 0532) SpO2:  [96 %-97 %] 97 % (08/01 0532)  Intake/Output from previous day:  Intake/Output Summary (Last 24 hours) at 04/20/12 1013 Last data filed at 04/20/12 0900  Gross per 24 hour  Intake    840 ml  Output    400 ml  Net    440 ml    Intake/Output this shift: Total I/O In: 360 [P.O.:360] Out: -   Labs:  Basename 04/20/12 0419 04/19/12 0401 04/18/12 0425  HGB 10.0* 10.8* 11.6*    Basename 04/20/12 0419 04/19/12 0401  WBC 8.1 12.2*  RBC 3.44* 3.74*  HCT 30.4* 32.9*  PLT 179 179    Basename 04/19/12 0401 04/18/12 0425  NA 130* 134*  K 3.7 4.6  CL 97 102  CO2 23 24  BUN 9 9  CREATININE 0.59 0.56  GLUCOSE 120* 154*  CALCIUM 8.6 8.6   No results found for this basename: LABPT:2,INR:2 in the last 72 hours  EXAM General - Patient is Alert, Appropriate and Oriented Extremity - Neurovascular intact Sensation intact distally Dorsiflexion/Plantar flexion intact Dressing/Incision - clean, dry, no drainage, healing Motor Function - intact, moving foot and toes well on exam.   Past Medical History  Diagnosis Date  . Arthritis   . Psoriasis   . GERD (gastroesophageal reflux disease)     no meds currently  . Nocturia   . Frequency of urination     Assessment/Plan: 2 Days Post-Op Procedure(s) (LRB): TOTAL KNEE ARTHROPLASTY (Right) Principal Problem:  *OA (osteoarthritis) of knee Active  Problems:  Postop Hyponatremia   Up with therapy Plan for discharge tomorrow Discharge to SNF  DVT Prophylaxis - Xarelto Weight-Bearing as tolerated to right leg  PERKINS, ALEXZANDREW 04/20/2012, 10:13 AM

## 2012-05-26 ENCOUNTER — Other Ambulatory Visit (HOSPITAL_COMMUNITY): Payer: Self-pay | Admitting: Family Medicine

## 2012-05-26 DIAGNOSIS — Z1231 Encounter for screening mammogram for malignant neoplasm of breast: Secondary | ICD-10-CM

## 2012-07-06 ENCOUNTER — Ambulatory Visit (HOSPITAL_COMMUNITY)
Admission: RE | Admit: 2012-07-06 | Discharge: 2012-07-06 | Disposition: A | Discharge status: Home / Self Care | Payer: Managed Care, Other (non HMO) | Source: Ambulatory Visit | Attending: Family Medicine | Admitting: Family Medicine

## 2012-07-06 DIAGNOSIS — Z1231 Encounter for screening mammogram for malignant neoplasm of breast: Secondary | ICD-10-CM | POA: Insufficient documentation

## 2013-06-06 ENCOUNTER — Other Ambulatory Visit (HOSPITAL_COMMUNITY): Payer: Self-pay | Admitting: Family Medicine

## 2013-06-06 DIAGNOSIS — Z1231 Encounter for screening mammogram for malignant neoplasm of breast: Secondary | ICD-10-CM

## 2013-07-09 ENCOUNTER — Ambulatory Visit (HOSPITAL_COMMUNITY): Payer: Medicare Other

## 2013-08-27 ENCOUNTER — Ambulatory Visit (HOSPITAL_COMMUNITY)
Admission: RE | Admit: 2013-08-27 | Discharge: 2013-08-27 | Disposition: A | Discharge status: Home / Self Care | Payer: Medicare Other | Source: Ambulatory Visit | Attending: Family Medicine | Admitting: Family Medicine

## 2013-08-27 DIAGNOSIS — Z1231 Encounter for screening mammogram for malignant neoplasm of breast: Secondary | ICD-10-CM | POA: Insufficient documentation

## 2014-10-04 ENCOUNTER — Other Ambulatory Visit (HOSPITAL_COMMUNITY): Payer: Self-pay | Admitting: Family Medicine

## 2014-10-04 DIAGNOSIS — Z1231 Encounter for screening mammogram for malignant neoplasm of breast: Secondary | ICD-10-CM

## 2014-10-09 ENCOUNTER — Other Ambulatory Visit (HOSPITAL_COMMUNITY): Payer: Self-pay | Admitting: Family Medicine

## 2014-10-09 ENCOUNTER — Ambulatory Visit (HOSPITAL_COMMUNITY)
Admission: RE | Admit: 2014-10-09 | Discharge: 2014-10-09 | Disposition: A | Discharge status: Home / Self Care | Payer: Medicare Other | Source: Ambulatory Visit | Attending: Family Medicine | Admitting: Family Medicine

## 2014-10-09 DIAGNOSIS — Z1231 Encounter for screening mammogram for malignant neoplasm of breast: Secondary | ICD-10-CM | POA: Insufficient documentation

## 2015-02-12 ENCOUNTER — Ambulatory Visit
Admission: RE | Admit: 2015-02-12 | Discharge: 2015-02-12 | Disposition: A | Discharge status: Home / Self Care | Payer: Medicare Other | Source: Ambulatory Visit | Attending: Family Medicine | Admitting: Family Medicine

## 2015-02-12 ENCOUNTER — Other Ambulatory Visit: Payer: Self-pay | Admitting: Family Medicine

## 2015-02-12 DIAGNOSIS — M542 Cervicalgia: Secondary | ICD-10-CM

## 2015-02-12 DIAGNOSIS — M545 Low back pain: Secondary | ICD-10-CM

## 2016-03-16 ENCOUNTER — Other Ambulatory Visit: Payer: Self-pay | Admitting: Family Medicine

## 2016-03-16 DIAGNOSIS — IMO0001 Reserved for inherently not codable concepts without codable children: Secondary | ICD-10-CM

## 2016-03-16 DIAGNOSIS — R1013 Epigastric pain: Secondary | ICD-10-CM

## 2016-03-16 DIAGNOSIS — K219 Gastro-esophageal reflux disease without esophagitis: Secondary | ICD-10-CM

## 2016-03-24 ENCOUNTER — Ambulatory Visit
Admission: RE | Admit: 2016-03-24 | Discharge: 2016-03-24 | Disposition: A | Discharge status: Home / Self Care | Payer: Medicare Other | Source: Ambulatory Visit | Attending: Family Medicine | Admitting: Family Medicine

## 2016-03-24 DIAGNOSIS — IMO0001 Reserved for inherently not codable concepts without codable children: Secondary | ICD-10-CM

## 2016-03-24 DIAGNOSIS — R1013 Epigastric pain: Secondary | ICD-10-CM

## 2016-03-24 DIAGNOSIS — K219 Gastro-esophageal reflux disease without esophagitis: Secondary | ICD-10-CM

## 2016-03-30 ENCOUNTER — Other Ambulatory Visit (HOSPITAL_COMMUNITY): Payer: Self-pay | Admitting: Family Medicine

## 2016-03-30 DIAGNOSIS — R1011 Right upper quadrant pain: Secondary | ICD-10-CM

## 2016-04-14 ENCOUNTER — Encounter (HOSPITAL_COMMUNITY)
Admission: RE | Admit: 2016-04-14 | Discharge: 2016-04-14 | Disposition: A | Discharge status: Home / Self Care | Payer: Medicare Other | Source: Ambulatory Visit | Attending: Family Medicine | Admitting: Family Medicine

## 2016-04-14 DIAGNOSIS — R1011 Right upper quadrant pain: Secondary | ICD-10-CM

## 2016-04-14 MED ORDER — TECHNETIUM TC 99M MEBROFENIN IV KIT
5.0000 | PACK | Freq: Once | INTRAVENOUS | Status: AC | PRN
  Administered 2016-04-14: 5 via INTRAVENOUS

## 2017-05-09 ENCOUNTER — Other Ambulatory Visit: Payer: Self-pay | Admitting: Family Medicine

## 2017-05-09 DIAGNOSIS — Z1231 Encounter for screening mammogram for malignant neoplasm of breast: Secondary | ICD-10-CM

## 2017-05-10 ENCOUNTER — Ambulatory Visit
Admission: RE | Admit: 2017-05-10 | Discharge: 2017-05-10 | Disposition: A | Discharge status: Home / Self Care | Payer: Medicare Other | Source: Ambulatory Visit | Attending: Family Medicine | Admitting: Family Medicine

## 2017-05-10 DIAGNOSIS — Z1231 Encounter for screening mammogram for malignant neoplasm of breast: Secondary | ICD-10-CM

## 2017-09-22 DIAGNOSIS — M48061 Spinal stenosis, lumbar region without neurogenic claudication: Secondary | ICD-10-CM | POA: Insufficient documentation

## 2017-09-22 DIAGNOSIS — M51369 Other intervertebral disc degeneration, lumbar region without mention of lumbar back pain or lower extremity pain: Secondary | ICD-10-CM | POA: Insufficient documentation

## 2017-09-22 DIAGNOSIS — G894 Chronic pain syndrome: Secondary | ICD-10-CM | POA: Insufficient documentation

## 2017-09-22 DIAGNOSIS — M5030 Other cervical disc degeneration, unspecified cervical region: Secondary | ICD-10-CM | POA: Insufficient documentation

## 2018-05-08 ENCOUNTER — Other Ambulatory Visit: Payer: Self-pay | Admitting: Family Medicine

## 2018-05-08 DIAGNOSIS — Z1231 Encounter for screening mammogram for malignant neoplasm of breast: Secondary | ICD-10-CM

## 2018-05-31 ENCOUNTER — Ambulatory Visit
Admission: RE | Admit: 2018-05-31 | Discharge: 2018-05-31 | Disposition: A | Discharge status: Home / Self Care | Payer: Medicare Other | Source: Ambulatory Visit | Attending: Family Medicine | Admitting: Family Medicine

## 2018-05-31 DIAGNOSIS — Z1231 Encounter for screening mammogram for malignant neoplasm of breast: Secondary | ICD-10-CM

## 2018-06-01 ENCOUNTER — Other Ambulatory Visit: Payer: Self-pay | Admitting: Family Medicine

## 2018-06-01 DIAGNOSIS — R921 Mammographic calcification found on diagnostic imaging of breast: Secondary | ICD-10-CM

## 2018-06-06 ENCOUNTER — Other Ambulatory Visit: Payer: Self-pay | Admitting: Family Medicine

## 2018-06-06 ENCOUNTER — Ambulatory Visit
Admission: RE | Admit: 2018-06-06 | Discharge: 2018-06-06 | Disposition: A | Discharge status: Home / Self Care | Payer: Medicare Other | Source: Ambulatory Visit | Attending: Family Medicine | Admitting: Family Medicine

## 2018-06-06 DIAGNOSIS — R921 Mammographic calcification found on diagnostic imaging of breast: Secondary | ICD-10-CM

## 2018-06-07 ENCOUNTER — Ambulatory Visit
Admission: RE | Admit: 2018-06-07 | Discharge: 2018-06-07 | Disposition: A | Discharge status: Home / Self Care | Payer: Medicare Other | Source: Ambulatory Visit | Attending: Family Medicine | Admitting: Family Medicine

## 2018-06-07 DIAGNOSIS — R921 Mammographic calcification found on diagnostic imaging of breast: Secondary | ICD-10-CM

## 2019-06-01 ENCOUNTER — Other Ambulatory Visit: Payer: Self-pay | Admitting: Family Medicine

## 2019-06-01 DIAGNOSIS — Z1231 Encounter for screening mammogram for malignant neoplasm of breast: Secondary | ICD-10-CM

## 2019-07-19 ENCOUNTER — Ambulatory Visit
Admission: RE | Admit: 2019-07-19 | Discharge: 2019-07-19 | Disposition: A | Discharge status: Home / Self Care | Payer: Medicare Other | Source: Ambulatory Visit | Attending: Family Medicine | Admitting: Family Medicine

## 2019-07-19 ENCOUNTER — Other Ambulatory Visit: Payer: Self-pay

## 2019-07-19 DIAGNOSIS — Z1231 Encounter for screening mammogram for malignant neoplasm of breast: Secondary | ICD-10-CM

## 2019-10-04 ENCOUNTER — Ambulatory Visit: Payer: Medicare Other | Attending: Internal Medicine

## 2019-10-04 DIAGNOSIS — Z23 Encounter for immunization: Secondary | ICD-10-CM | POA: Insufficient documentation

## 2019-10-04 NOTE — Progress Notes (Signed)
   Covid-19 Vaccination Clinic  Name:  Alyssa Jacobs    MRN: [id number] DOB: Jun 27, 1941  10/04/2019  [name] was observed post Covid-19 immunization for 15 minutes without incidence. She was provided with Vaccine Information Sheet and instruction to access the V-Safe system.   [name] was instructed to call 911 with any severe reactions post vaccine: Marland Kitchen Difficulty breathing  . Swelling of your face and throat  . A fast heartbeat  . A bad rash all over your body  . Dizziness and weakness    Immunizations Administered    Name Date Dose VIS Date Route   Pfizer COVID-19 Vaccine 10/04/2019  8:20 AM 0.3 mL 08/31/2019 Intramuscular   Manufacturer: [employer], [employer]   Lot: [id number]   [place]: [id number]

## 2019-10-25 ENCOUNTER — Ambulatory Visit: Payer: Medicare Other | Attending: Internal Medicine

## 2019-10-25 DIAGNOSIS — Z23 Encounter for immunization: Secondary | ICD-10-CM

## 2019-10-25 NOTE — Progress Notes (Signed)
   Covid-19 Vaccination Clinic  Name:  Lexxi Zachry    MRN: [id number] DOB: 01/26/1941  10/25/2019  Ms. Lacerda was observed post Covid-19 immunization for 15 minutes without incidence. She was provided with Vaccine Information Sheet and instruction to access the V-Safe system.   Ms. Cozad was instructed to call 911 with any severe reactions post vaccine: Marland Kitchen Difficulty breathing  . Swelling of your face and throat  . A fast heartbeat  . A bad rash all over your body  . Dizziness and weakness    Immunizations Administered    Name Date Dose VIS Date Route   Pfizer COVID-19 Vaccine 10/25/2019  9:38 AM 0.3 mL 08/31/2019 Intramuscular   Manufacturer: [place]   Lot: [id number]   [place]: [id number]

## 2019-12-25 ENCOUNTER — Other Ambulatory Visit: Payer: Self-pay | Admitting: Family Medicine

## 2019-12-25 DIAGNOSIS — M8580 Other specified disorders of bone density and structure, unspecified site: Secondary | ICD-10-CM

## 2019-12-25 DIAGNOSIS — Z1231 Encounter for screening mammogram for malignant neoplasm of breast: Secondary | ICD-10-CM

## 2020-06-09 DIAGNOSIS — M1712 Unilateral primary osteoarthritis, left knee: Secondary | ICD-10-CM | POA: Insufficient documentation

## 2020-06-09 DIAGNOSIS — Z96651 Presence of right artificial knee joint: Secondary | ICD-10-CM | POA: Insufficient documentation

## 2020-06-26 DIAGNOSIS — M545 Low back pain, unspecified: Secondary | ICD-10-CM | POA: Insufficient documentation

## 2020-09-01 DIAGNOSIS — M5416 Radiculopathy, lumbar region: Secondary | ICD-10-CM | POA: Insufficient documentation

## 2020-09-15 ENCOUNTER — Other Ambulatory Visit: Payer: Self-pay

## 2020-09-15 ENCOUNTER — Ambulatory Visit: Payer: Medicare Other | Attending: Internal Medicine

## 2020-09-15 DIAGNOSIS — Z23 Encounter for immunization: Secondary | ICD-10-CM

## 2020-09-15 NOTE — Progress Notes (Signed)
   Covid-19 Vaccination Clinic  Name:  Tyreka Henneke    MRN: [id number] DOB: 26-May-1941  09/15/2020  Ms. Mestre was observed post Covid-19 immunization for 15 minutes without incident. She was provided with Vaccine Information Sheet and instruction to access the V-Safe system.   Ms. Dula was instructed to call 911 with any severe reactions post vaccine: Marland Kitchen Difficulty breathing  . Swelling of face and throat  . A fast heartbeat  . A bad rash all over body  . Dizziness and weakness   Immunizations Administered    Name Date Dose VIS Date Route   Pfizer COVID-19 Vaccine 09/15/2020  3:26 PM 0.3 mL 07/09/2020 Intramuscular   Manufacturer: [employer], [employer]   Lot: [id number]   [place]: [address]-1

## 2020-09-26 ENCOUNTER — Other Ambulatory Visit: Payer: Self-pay | Admitting: Family Medicine

## 2020-09-26 DIAGNOSIS — Z1231 Encounter for screening mammogram for malignant neoplasm of breast: Secondary | ICD-10-CM

## 2020-11-06 ENCOUNTER — Ambulatory Visit
Admission: RE | Admit: 2020-11-06 | Discharge: 2020-11-06 | Disposition: A | Discharge status: Home / Self Care | Payer: Medicare Other | Source: Ambulatory Visit | Attending: Family Medicine | Admitting: Family Medicine

## 2020-11-06 ENCOUNTER — Other Ambulatory Visit: Payer: Self-pay

## 2020-11-06 DIAGNOSIS — Z1231 Encounter for screening mammogram for malignant neoplasm of breast: Secondary | ICD-10-CM | POA: Diagnosis not present

## 2020-11-24 DIAGNOSIS — Z0000 Encounter for general adult medical examination without abnormal findings: Secondary | ICD-10-CM | POA: Diagnosis not present

## 2020-11-24 DIAGNOSIS — M150 Primary generalized (osteo)arthritis: Secondary | ICD-10-CM | POA: Diagnosis not present

## 2020-11-24 DIAGNOSIS — R002 Palpitations: Secondary | ICD-10-CM | POA: Diagnosis not present

## 2020-11-24 DIAGNOSIS — E559 Vitamin D deficiency, unspecified: Secondary | ICD-10-CM | POA: Diagnosis not present

## 2020-11-24 DIAGNOSIS — R7303 Prediabetes: Secondary | ICD-10-CM | POA: Diagnosis not present

## 2020-11-24 DIAGNOSIS — R350 Frequency of micturition: Secondary | ICD-10-CM | POA: Diagnosis not present

## 2020-11-27 ENCOUNTER — Other Ambulatory Visit: Payer: Self-pay | Admitting: Family Medicine

## 2020-11-27 DIAGNOSIS — E2839 Other primary ovarian failure: Secondary | ICD-10-CM

## 2020-12-22 DIAGNOSIS — H02889 Meibomian gland dysfunction of unspecified eye, unspecified eyelid: Secondary | ICD-10-CM | POA: Diagnosis not present

## 2020-12-22 DIAGNOSIS — H25813 Combined forms of age-related cataract, bilateral: Secondary | ICD-10-CM | POA: Diagnosis not present

## 2021-04-21 DIAGNOSIS — R300 Dysuria: Secondary | ICD-10-CM | POA: Diagnosis not present

## 2021-04-23 DIAGNOSIS — L821 Other seborrheic keratosis: Secondary | ICD-10-CM | POA: Diagnosis not present

## 2021-04-23 DIAGNOSIS — B078 Other viral warts: Secondary | ICD-10-CM | POA: Diagnosis not present

## 2021-04-23 DIAGNOSIS — L814 Other melanin hyperpigmentation: Secondary | ICD-10-CM | POA: Diagnosis not present

## 2021-05-07 DIAGNOSIS — M25562 Pain in left knee: Secondary | ICD-10-CM | POA: Diagnosis not present

## 2021-05-07 DIAGNOSIS — Z96651 Presence of right artificial knee joint: Secondary | ICD-10-CM | POA: Diagnosis not present

## 2021-06-12 DIAGNOSIS — M150 Primary generalized (osteo)arthritis: Secondary | ICD-10-CM | POA: Diagnosis not present

## 2021-06-12 DIAGNOSIS — Z23 Encounter for immunization: Secondary | ICD-10-CM | POA: Diagnosis not present

## 2021-06-29 ENCOUNTER — Emergency Department (HOSPITAL_BASED_OUTPATIENT_CLINIC_OR_DEPARTMENT_OTHER)
Admission: EM | Admit: 2021-06-29 | Discharge: 2021-06-29 | Disposition: A | Discharge status: Home / Self Care | Payer: Medicare Other | Attending: Emergency Medicine | Admitting: Emergency Medicine

## 2021-06-29 ENCOUNTER — Encounter (HOSPITAL_BASED_OUTPATIENT_CLINIC_OR_DEPARTMENT_OTHER): Payer: Self-pay | Admitting: *Deleted

## 2021-06-29 ENCOUNTER — Other Ambulatory Visit: Payer: Self-pay

## 2021-06-29 ENCOUNTER — Emergency Department (HOSPITAL_BASED_OUTPATIENT_CLINIC_OR_DEPARTMENT_OTHER): Payer: Medicare Other | Admitting: Radiology

## 2021-06-29 DIAGNOSIS — S20212A Contusion of left front wall of thorax, initial encounter: Secondary | ICD-10-CM

## 2021-06-29 DIAGNOSIS — Z96651 Presence of right artificial knee joint: Secondary | ICD-10-CM | POA: Diagnosis not present

## 2021-06-29 DIAGNOSIS — W06XXXA Fall from bed, initial encounter: Secondary | ICD-10-CM | POA: Diagnosis not present

## 2021-06-29 DIAGNOSIS — S2232XA Fracture of one rib, left side, initial encounter for closed fracture: Secondary | ICD-10-CM | POA: Diagnosis not present

## 2021-06-29 DIAGNOSIS — S299XXA Unspecified injury of thorax, initial encounter: Secondary | ICD-10-CM | POA: Diagnosis present

## 2021-06-29 DIAGNOSIS — R0781 Pleurodynia: Secondary | ICD-10-CM | POA: Diagnosis not present

## 2021-06-29 DIAGNOSIS — Z87891 Personal history of nicotine dependence: Secondary | ICD-10-CM | POA: Insufficient documentation

## 2021-06-29 DIAGNOSIS — W010XXA Fall on same level from slipping, tripping and stumbling without subsequent striking against object, initial encounter: Secondary | ICD-10-CM

## 2021-06-29 MED ORDER — TRAMADOL HCL 50 MG PO TABS: 50.0000 mg | ORAL_TABLET | Freq: Four times a day (QID) | ORAL | 0 refills | Status: DC | PRN

## 2021-06-29 MED ORDER — TRAMADOL HCL 50 MG PO TABS
50.0000 mg | ORAL_TABLET | Freq: Once | ORAL | Status: AC
  Administered 2021-06-29: 50 mg via ORAL
  Filled 2021-06-29: qty 1

## 2021-06-29 MED ORDER — ACETAMINOPHEN 500 MG PO TABS
1000.0000 mg | ORAL_TABLET | Freq: Once | ORAL | Status: AC
Start: 2021-06-29 — End: 2021-06-29
  Administered 2021-06-29: 1000 mg via ORAL
  Filled 2021-06-29: qty 2

## 2021-06-29 NOTE — ED Provider Notes (Signed)
[facility] Provider Note   CSN: [id number] Arrival date & time: 06/29/21  0539     History Chief Complaint  Patient presents with   Fall    Rib    Alyssa Jacobs is a 80 y.o. female.  Pt c/o fall this AM. States early AM had gotten out of bed to go to bathroom, tripped, fell, hitting left lateral chest wall. C/o point tenderness to area, lower leg lateral rib. Symptoms acute onset, moderate, constant, dull, non radiating, worse w  certain movements and palpation area. No sob. No loc or headache. No neck or back pain. No abd pain or nv. Skin intact. No anticoagulant use (remote blood thinner use, none recently).  No faintness or dizziness prior to fall.    The history is provided by the patient, a relative and medical records.  Fall Pertinent negatives include no abdominal pain, no headaches and no shortness of breath.      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     No family history on file.  Social History   Tobacco Use   Smoking status: Former    Types: Cigarettes    Quit date: 04/10/2000    Years since quitting: 21.2   Smokeless tobacco: Never  Vaping Use   Vaping Use: Never used  Substance Use Topics   Alcohol use: No   Drug use: No    Home Medications Prior to Admission medications   Medication Sig Start Date End Date Taking? Authorizing Provider  rivaroxaban (XARELTO) 10 MG  TABS tablet Take 1 tablet (10 mg total) by mouth daily with breakfast. Take Xarelto for two and a half more weeks, then discontinue Xarelto. 04/20/12   Perkins, Alexzandrew L, PA-C    Allergies    Codeine and Sulfa antibiotics  Review of Systems   Review of Systems  Constitutional:  Negative for fever.  HENT:  Negative for nosebleeds.   Eyes:  Negative for redness.  Respiratory:  Negative for shortness of breath.   Cardiovascular:        Left lateral chest wall pain/contusion.   Gastrointestinal:  Negative for abdominal pain, nausea and vomiting.  Genitourinary:  Negative for flank pain.  Musculoskeletal:  Negative for back pain and neck pain.  Skin:  Negative for wound.  Neurological:  Negative for headaches.  Hematological:  Does not bruise/bleed easily.       No anticoag use.   Psychiatric/Behavioral:  Negative for confusion.    Physical Exam Updated Vital Signs BP (!) 108/50 (BP Location: Left Arm)   Pulse 64   Temp 98.1 F (36.7 C) (Oral)   Resp 17   Ht 1.626 m (5\' 4" )   Wt 81.6 kg   SpO2 99%   BMI 30.90 kg/m   Physical Exam Vitals and nursing note reviewed.  Constitutional:  Appearance: Normal appearance. She is well-developed.  HENT:     Head: Atraumatic.     Nose: Nose normal.     Mouth/Throat:     Mouth: Mucous membranes are moist.  Eyes:     General: No scleral icterus.    Conjunctiva/sclera: Conjunctivae normal.     Pupils: Pupils are equal, round, and reactive to light.  Neck:     Trachea: No tracheal deviation.  Cardiovascular:     Rate and Rhythm: Normal rate and regular rhythm.     Pulses: Normal pulses.     Heart sounds: Normal heart sounds. No murmur heard.   No friction rub. No gallop.  Pulmonary:     Effort: Pulmonary effort is normal. No respiratory distress.     Breath sounds: Normal breath sounds.     Comments: Point tenderness and small bruise to left lower/lateral rib, reproducing patient's symptoms. No crepitus. Normal chest  movement.  Abdominal:     General: Bowel sounds are normal. There is no distension.     Palpations: Abdomen is soft.     Tenderness: There is no abdominal tenderness.     Comments: No abd contusion, bruising, pain or tenderness.   Genitourinary:    Comments: No cva tenderness.  Musculoskeletal:        General: No swelling.     Cervical back: Normal range of motion and neck supple. No rigidity or tenderness. No muscular tenderness.     Comments: CTLS spine, non tender, aligned, no step off. No focal extremity pain or tenderness.   Skin:    General: Skin is warm and dry.     Findings: No rash.  Neurological:     Mental Status: She is alert.     Comments: Alert, speech normal. GCS 15. Motor/sens grossly intact bil. Steady gait.   Psychiatric:        Mood and Affect: Mood normal.    ED Results / Procedures / Treatments   Labs (all labs ordered are listed, but only abnormal results are displayed) Labs Reviewed - No data to display  EKG None  Radiology DG Ribs Unilateral W/Chest Left  Result Date: 06/29/2021 CLINICAL DATA:  Anterior left chest pain after falling today. EXAM: LEFT RIBS AND CHEST - 3+ VIEW COMPARISON:  Chest radiograph 03/15/2012. FINDINGS: There is an acute fracture of the left 10th rib antral laterally, best seen on the AP view of the lower left chest. This is in the area of pain as indicated by an overlying BB. There is no displaced fracture or pneumothorax. There may be a small left pleural effusion. The heart size and mediastinal contours are stable. The lungs are otherwise clear. Moderate glenohumeral degenerative changes are present bilaterally. IMPRESSION: 1. Acute nondisplaced fracture of the left 10th rib anterolaterally with possible small adjacent left pleural effusion. 2. No pneumothorax. Electronically Signed   By: Richardean Sale M.D.   On: 06/29/2021 11:42    Procedures Procedures   Medications Ordered in ED Medications  acetaminophen (TYLENOL)  tablet 1,000 mg (has no administration in time range)  traMADol (ULTRAM) tablet 50 mg (has no administration in time range)    ED Course  I have reviewed the triage vital signs and the nursing notes.  Pertinent labs & imaging results that were available during my care of the patient were reviewed by me and considered in my medical decision making (see chart for details).    MDM Rules/Calculators/A&P  Xrays ordered.   Reviewed nursing notes and prior charts for additional history.    No meds pta or this AM. Acetaminophen po. Ultram po.  Xrays reviewed/interpreted by me - + rib fx.   Pt appears stable for d/c.    Final Clinical Impression(s) / ED Diagnoses Final diagnoses:  None    Rx / DC Orders ED Discharge Orders     None        Lajean Saver, MD 06/29/21 1202

## 2021-06-29 NOTE — ED Triage Notes (Signed)
Pt fell out of bed last night, left rib injury (seen at [facility]) they sent her here for further testing to r/o possibility of more injuries.

## 2021-06-29 NOTE — Discharge Instructions (Addendum)
It was our pleasure to provide your ER care today - we hope that you feel better.  Stay active. Take full and deep breaths.   Take acetaminophen or ibuprofen as need. You may also take ultram as need for pain - no driving when taking.   Return to ER if worse, new symptoms, fevers, trouble breathing, new/severe pain, or other concern.

## 2021-07-08 ENCOUNTER — Other Ambulatory Visit: Payer: Self-pay | Admitting: Family Medicine

## 2021-07-08 DIAGNOSIS — M8580 Other specified disorders of bone density and structure, unspecified site: Secondary | ICD-10-CM

## 2021-07-13 DIAGNOSIS — R0781 Pleurodynia: Secondary | ICD-10-CM | POA: Diagnosis not present

## 2021-07-13 DIAGNOSIS — S2232XD Fracture of one rib, left side, subsequent encounter for fracture with routine healing: Secondary | ICD-10-CM | POA: Diagnosis not present

## 2021-08-06 ENCOUNTER — Ambulatory Visit
Admission: EM | Admit: 2021-08-06 | Discharge: 2021-08-06 | Disposition: A | Discharge status: Home / Self Care | Payer: Medicare Other | Attending: Physician Assistant | Admitting: Physician Assistant

## 2021-08-06 ENCOUNTER — Other Ambulatory Visit: Payer: Self-pay

## 2021-08-06 DIAGNOSIS — J40 Bronchitis, not specified as acute or chronic: Secondary | ICD-10-CM | POA: Diagnosis not present

## 2021-08-06 DIAGNOSIS — J329 Chronic sinusitis, unspecified: Secondary | ICD-10-CM | POA: Diagnosis not present

## 2021-08-06 DIAGNOSIS — J101 Influenza due to other identified influenza virus with other respiratory manifestations: Secondary | ICD-10-CM

## 2021-08-06 LAB — POCT INFLUENZA A/B
Influenza A, POC: POSITIVE — AB
Influenza B, POC: NEGATIVE

## 2021-08-06 MED ORDER — DOXYCYCLINE HYCLATE 100 MG PO CAPS: 100.0000 mg | ORAL_CAPSULE | Freq: Two times a day (BID) | ORAL | 0 refills | Status: DC

## 2021-08-06 NOTE — ED Triage Notes (Signed)
Pt c/o cough, sore throat, headache, diarrhea, malaise, fatigue  Denies nausea, vomiting, constipation  Onset a week ago. States has had flu exposure through family.

## 2021-08-06 NOTE — ED Provider Notes (Signed)
[facility]    CSN: [id number] Arrival date & time: 08/06/21  0808      History   Chief Complaint Chief Complaint  Patient presents with   Cough    HPI Alyssa Jacobs is a 80 y.o. female.   Patient here today for evaluation of congestion, cough, headache, sore throat, diarrhea that started about a week ago.  She states she has had multiple family members that have had the flu.  She has taken over-the-counter medication without significant relief.  She denies any nausea or vomiting.  The history is provided by the patient.  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      Home Medications    Prior to Admission medications   Medication Sig Start Date End Date Taking? Authorizing Provider  doxycycline (VIBRAMYCIN) 100 MG capsule Take 1 capsule (100 mg total) by mouth 2 (two) times daily. 08/06/21  Yes Francene Finders, PA-C  rivaroxaban (XARELTO) 10 MG TABS tablet Take 1 tablet (10 mg total) by mouth daily with breakfast. Take Xarelto for two and a half more weeks, then discontinue Xarelto. 04/20/12   Perkins, Alexzandrew L, PA-C  traMADol (ULTRAM) 50 MG tablet Take 1 tablet (50 mg total) by mouth every 6 (six) hours as needed. 06/29/21   Lajean Saver, MD     Family History History reviewed. No pertinent family history.  Social History Social History   Tobacco Use   Smoking status: Former    Types: Cigarettes    Quit date: 04/10/2000    Years since quitting: 21.3   Smokeless tobacco: Never  Vaping Use   Vaping Use: Never used  Substance Use Topics   Alcohol use: No   Drug use: No     Allergies   Codeine and Sulfa antibiotics   Review of Systems Review of Systems  Constitutional:  Negative for chills and fever.  HENT:  Positive for congestion, sinus pressure and sore throat. Negative for ear pain.   Eyes:  Negative for discharge and redness.  Respiratory:  Positive for cough. Negative for shortness of breath and wheezing.   Gastrointestinal:  Positive for diarrhea. Negative for abdominal pain, nausea and vomiting.    Physical Exam Triage Vital Signs ED Triage Vitals [08/06/21 0835]  Enc Vitals Group     BP (!) 146/73     Pulse Rate 71     Resp 18     Temp 97.6 F (36.4 C)     Temp Source Oral     SpO2 97 %     Weight      Height      Head Circumference      Peak Flow  Pain Score 0     Pain Loc      Pain Edu?      Excl. in [place]?    No data found.  Updated Vital Signs BP (!) 146/73 (BP Location: Right Arm)   Pulse 71   Temp 97.6 F (36.4 C) (Oral)   Resp 18   SpO2 97%      Physical Exam Vitals and nursing note reviewed.  Constitutional:      General: She is not in acute distress.    Appearance: Normal appearance. She is not ill-appearing.  HENT:     Head: Normocephalic and atraumatic.     Right Ear: Tympanic membrane normal.     Left Ear: Tympanic membrane normal.     Nose: Congestion present.     Mouth/Throat:     Mouth: Mucous membranes are moist.     Pharynx: No oropharyngeal exudate or posterior oropharyngeal erythema.  Eyes:     Conjunctiva/sclera: Conjunctivae normal.  Cardiovascular:     Rate and Rhythm: Normal rate and regular rhythm.     Heart sounds: Normal heart sounds. No  murmur heard. Pulmonary:     Effort: Pulmonary effort is normal. No respiratory distress.     Breath sounds: Normal breath sounds. No wheezing, rhonchi or rales.  Skin:    General: Skin is warm and dry.  Neurological:     Mental Status: She is alert.  Psychiatric:        Mood and Affect: Mood normal.        Thought Content: Thought content normal.     UC Treatments / Results  Labs (all labs ordered are listed, but only abnormal results are displayed) Labs Reviewed  POCT INFLUENZA A/B - Abnormal; Notable for the following components:      Result Value   Influenza A, POC Positive (*)    All other components within normal limits    EKG   Radiology No results found.  Procedures Procedures (including critical care time)  Medications Ordered in UC Medications - No data to display  Initial Impression / Assessment and Plan / UC Course  I have reviewed the triage vital signs and the nursing notes.  Pertinent labs & imaging results that were available during my care of the patient were reviewed by me and considered in my medical decision making (see chart for details).  Flu test positive in office, however given duration of symptoms will defer Tamiflu at this time.  Suspect patient has now developed likely sinus infection/ bronchitis from viral illness and will prescribe doxycycline for same.  Encouraged follow-up if symptoms fail to improve or worsen anyway.  Final Clinical Impressions(s) / UC Diagnoses   Final diagnoses:  Influenza A  Sinobronchitis   Discharge Instructions   None    ED Prescriptions     Medication Sig Dispense Auth. Provider   doxycycline (VIBRAMYCIN) 100 MG capsule Take 1 capsule (100 mg total) by mouth 2 (two) times daily. 20 capsule Francene Finders, PA-C      PDMP not reviewed this encounter.   Francene Finders, PA-C 08/06/21 1246

## 2021-10-06 DIAGNOSIS — R052 Subacute cough: Secondary | ICD-10-CM | POA: Diagnosis not present

## 2021-12-11 ENCOUNTER — Other Ambulatory Visit: Payer: Self-pay | Admitting: Family Medicine

## 2021-12-11 DIAGNOSIS — Z1231 Encounter for screening mammogram for malignant neoplasm of breast: Secondary | ICD-10-CM

## 2021-12-16 ENCOUNTER — Ambulatory Visit
Admission: RE | Admit: 2021-12-16 | Discharge: 2021-12-16 | Disposition: A | Discharge status: Home / Self Care | Payer: Medicare Other | Source: Ambulatory Visit | Attending: Family Medicine | Admitting: Family Medicine

## 2021-12-16 DIAGNOSIS — Z1231 Encounter for screening mammogram for malignant neoplasm of breast: Secondary | ICD-10-CM | POA: Diagnosis not present

## 2021-12-17 DIAGNOSIS — Z23 Encounter for immunization: Secondary | ICD-10-CM | POA: Diagnosis not present

## 2021-12-17 DIAGNOSIS — Z0000 Encounter for general adult medical examination without abnormal findings: Secondary | ICD-10-CM | POA: Diagnosis not present

## 2021-12-17 DIAGNOSIS — M150 Primary generalized (osteo)arthritis: Secondary | ICD-10-CM | POA: Diagnosis not present

## 2021-12-17 DIAGNOSIS — M791 Myalgia, unspecified site: Secondary | ICD-10-CM | POA: Diagnosis not present

## 2021-12-17 DIAGNOSIS — Z79899 Other long term (current) drug therapy: Secondary | ICD-10-CM | POA: Diagnosis not present

## 2021-12-17 DIAGNOSIS — E559 Vitamin D deficiency, unspecified: Secondary | ICD-10-CM | POA: Diagnosis not present

## 2021-12-17 DIAGNOSIS — R7303 Prediabetes: Secondary | ICD-10-CM | POA: Diagnosis not present

## 2022-01-12 DIAGNOSIS — M8589 Other specified disorders of bone density and structure, multiple sites: Secondary | ICD-10-CM | POA: Diagnosis not present

## 2022-01-12 DIAGNOSIS — M85852 Other specified disorders of bone density and structure, left thigh: Secondary | ICD-10-CM | POA: Diagnosis not present

## 2022-01-20 DIAGNOSIS — M859 Disorder of bone density and structure, unspecified: Secondary | ICD-10-CM | POA: Diagnosis not present

## 2022-01-20 DIAGNOSIS — M791 Myalgia, unspecified site: Secondary | ICD-10-CM | POA: Diagnosis not present

## 2022-01-20 DIAGNOSIS — M25561 Pain in right knee: Secondary | ICD-10-CM | POA: Diagnosis not present

## 2022-01-20 DIAGNOSIS — M549 Dorsalgia, unspecified: Secondary | ICD-10-CM | POA: Diagnosis not present

## 2022-01-20 DIAGNOSIS — M25512 Pain in left shoulder: Secondary | ICD-10-CM | POA: Diagnosis not present

## 2022-01-20 DIAGNOSIS — M25562 Pain in left knee: Secondary | ICD-10-CM | POA: Diagnosis not present

## 2022-01-20 DIAGNOSIS — R053 Chronic cough: Secondary | ICD-10-CM | POA: Diagnosis not present

## 2022-01-26 DIAGNOSIS — H10013 Acute follicular conjunctivitis, bilateral: Secondary | ICD-10-CM | POA: Diagnosis not present

## 2022-01-26 DIAGNOSIS — H25813 Combined forms of age-related cataract, bilateral: Secondary | ICD-10-CM | POA: Diagnosis not present

## 2022-02-05 DIAGNOSIS — N3001 Acute cystitis with hematuria: Secondary | ICD-10-CM | POA: Diagnosis not present

## 2022-03-03 DIAGNOSIS — R0609 Other forms of dyspnea: Secondary | ICD-10-CM | POA: Diagnosis not present

## 2022-03-03 DIAGNOSIS — R053 Chronic cough: Secondary | ICD-10-CM | POA: Diagnosis not present

## 2022-03-03 DIAGNOSIS — M791 Myalgia, unspecified site: Secondary | ICD-10-CM | POA: Diagnosis not present

## 2022-03-04 ENCOUNTER — Other Ambulatory Visit: Payer: Self-pay | Admitting: Family Medicine

## 2022-03-04 ENCOUNTER — Ambulatory Visit
Admission: RE | Admit: 2022-03-04 | Discharge: 2022-03-04 | Disposition: A | Discharge status: Home / Self Care | Payer: Medicare Other | Source: Ambulatory Visit | Attending: Family Medicine | Admitting: Family Medicine

## 2022-03-04 DIAGNOSIS — R059 Cough, unspecified: Secondary | ICD-10-CM | POA: Diagnosis not present

## 2022-03-04 DIAGNOSIS — R0609 Other forms of dyspnea: Secondary | ICD-10-CM

## 2022-03-04 DIAGNOSIS — R0602 Shortness of breath: Secondary | ICD-10-CM | POA: Diagnosis not present

## 2022-03-05 ENCOUNTER — Ambulatory Visit
Admission: RE | Admit: 2022-03-05 | Discharge: 2022-03-05 | Disposition: A | Discharge status: Home / Self Care | Payer: Medicare Other | Source: Ambulatory Visit | Attending: Family Medicine | Admitting: Family Medicine

## 2022-03-05 DIAGNOSIS — R7989 Other specified abnormal findings of blood chemistry: Secondary | ICD-10-CM | POA: Diagnosis not present

## 2022-03-05 DIAGNOSIS — R0609 Other forms of dyspnea: Secondary | ICD-10-CM

## 2022-03-05 DIAGNOSIS — I2510 Atherosclerotic heart disease of native coronary artery without angina pectoris: Secondary | ICD-10-CM | POA: Diagnosis not present

## 2022-03-05 DIAGNOSIS — I700 Atherosclerosis of aorta: Secondary | ICD-10-CM | POA: Diagnosis not present

## 2022-03-05 MED ORDER — IOPAMIDOL (ISOVUE-370) INJECTION 76%
75.0000 mL | Freq: Once | INTRAVENOUS | Status: AC | PRN
  Administered 2022-03-05: 75 mL via INTRAVENOUS

## 2022-03-17 NOTE — Progress Notes (Signed)
Cardiology Office Note:   Date:  03/18/2022  NAME:  Alyssa Jacobs    MRN: [id number] DOB:  Jan 22, 1941   PCP:  Sigmund Hazel, MD  Cardiologist:  Reatha Harps, MD  Electrophysiologist:  None   Referring MD: Sigmund Hazel, MD   Chief Complaint  Patient presents with   Shortness of Breath         History of Present Illness:   Alyssa Jacobs is a 81 y.o. female with a hx of arthritis who is being seen today for the evaluation of coronary calcifications at the request of Sigmund Hazel, MD. he reports medical history of mainly arthritis.  Apparently over the past year she has had intermittent shortness of breath.  She did have COVID-19 infection 1 year ago.  She reports shortness of breath with mainly activity.  She has maintain a high level of activity.  She can exercise on the elliptical 3-4 times a week up to 45 minutes.  She was evaluated for shortness of breath and had a D-dimer that was positive.  She underwent CT PE study that was negative.  She had mild coronary calcifications on my review.  She had mild aortic atherosclerosis.  She was then referred to cardiology.  Labs from primary care physician demonstrate BNP 34.  TSH 2.9.  Hemoglobin 13.8.  She is not diabetic.  A1c 5.8.  She has never had a heart attack or stroke.  Her EKG demonstrates sinus rhythm with PVCs and nonspecific ST-T changes.  She reports intermittent chest tightness.  Can occur randomly.  She attributes this to her arthritis.  Can occur at night.  Does not occur with activity recently.  She is quite alarmed by her coronary calcium.  She requests to make sure that her heart is okay.  She has no evidence of heart failure on exam.  She does not have any sensation of extra heartbeats.  She overall is without other complaints and examination is quite normal.  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   Vitamin D, Ergocalciferol, (DRISDOL) 1.25 MG (50000 UNIT) CAPS capsule TAKE 1 CAPSULE BY MOUTH 3 TIMES A WEEK   [DISCONTINUED] metoprolol tartrate (LOPRESSOR) 100 MG tablet TAKE 1 TABLET 2 HR PRIOR TO CARDIAC PROCEDURE     Allergies:    Codeine and Sulfa antibiotics   Social History: Social History   Socioeconomic History   Marital status: Divorced    Spouse name: Not on file   Number of children: 2   Years of education: Not on file   Highest education level: Not on file  Occupational History   Occupation: Retired Sales executive  Tobacco Use   Smoking status: Former    Types: Cigarettes    Quit date: 04/10/2000    Years since quitting: 21.9   Smokeless tobacco: Never  Vaping Use   Vaping Use: Never used  Substance and Sexual Activity   Alcohol use: No   Drug use: No   Sexual activity: Not on file  Other Topics Concern   Not on file  Social History Narrative   Not on file   Social Determinants of Health   Financial Resource Strain: Not on file  Food Insecurity: Not on file  Transportation Needs: Not on file  Physical Activity: Not on file  Stress: Not on file  Social Connections: Not on file     Family History: The patient's family history includes Heart disease in her mother.  ROS:   All other ROS reviewed and negative. Pertinent positives noted in the HPI.     EKGs/Labs/Other Studies Reviewed:   The following studies were personally reviewed by me today:  EKG:  EKG is ordered today.   The ekg ordered today demonstrates normal sinus rhythm heart rate 81, single PAC, nonspecific ST-T changes, and was personally reviewed by me.   Recent Labs: No results found for requested labs within last 365 days.   Recent Lipid Panel No results found for: "CHOL", "TRIG", "HDL", "CHOLHDL", "VLDL", "LDLCALC", "LDLDIRECT"  Physical Exam:   VS:  BP 136/74   Pulse 81   Ht 5\' 9"  (1.753 m)   Wt 175 lb 12.8 oz (79.7 kg)   SpO2 98%   BMI 25.96 kg/m    Wt Readings from Last 3 Encounters:  03/18/22 175 lb 12.8 oz (79.7 kg)  06/29/21 180 lb (81.6 kg)  04/17/12 171 lb (77.6 kg)    General: Well nourished, well developed, in no acute distress Head: Atraumatic, normal size  Eyes: PEERLA, EOMI  Neck: Supple, no JVD Endocrine: No thryomegaly Cardiac: Normal S1, S2; RRR; no murmurs, rubs, or gallops Lungs: Clear to auscultation bilaterally, no wheezing, rhonchi or rales  Abd: Soft, nontender, no hepatomegaly  Ext: No edema, pulses 2+ Musculoskeletal: No deformities, BUE and BLE strength normal and equal Skin: Warm and dry, no rashes   Neuro: Alert and oriented to person, place, time, and situation, CNII-XII grossly intact, no focal deficits  Psych: Normal mood and affect   ASSESSMENT:   Alyssa Jacobs is a 81 y.o. female who presents for the following: 1. Precordial pain   2. SOB (shortness of breath) on exertion   3. 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 PVC (premature ventricular contraction)   5. Pre-procedure lab exam     PLAN:   1. Precordial pain -She reports intermittent chest tightness.  Mainly occurs at rest.  Really difficult to delineate her symptoms.  EKG demonstrates sinus rhythm with nonspecific ST-T changes and 2 PVCs.  Her CT scan shows mild coronary calcifications and aortic atherosclerosis.  Given that her symptoms are difficult to delineate we will proceed with coronary CTA to exclude obstructive CAD.  She will take 100 mg of metoprolol 2  hours before the scan.  She will also give [place] a kidney profile as well as lipids.  Likely needs to be on a statin but we will determine this based on her coronary calcium score.  She may just have calcium as expected for her age.  We will make recommendations based on lipid level as well as calcium scoring.  2. SOB (shortness of breath) on exertion -Unclear etiology.  No signs of heart failure today.  EKG with single PVCs.  We will check an echo given COVID-19 infection.  Recent BNP is normal.  She  has no signs of volume overload.  Suspect this is all just deconditioning.  Coronary CTA as above.  3. 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  4. PVC (premature ventricular contraction) -PVC on today's EKG.  No symptoms from this.  We will check an echo.  Recent thyroid study is normal.  Does not need treatment unless her heart function is abnormal.  Disposition: Return if symptoms worsen or fail to improve.  Medication Adjustments/Labs and Tests Ordered: Current medicines are reviewed at length with the patient today.  Concerns regarding medicines are outlined above.  Orders Placed This Encounter  Procedures   CT CORONARY MORPH W/CTA COR W/SCORE W/CA W/CM &/OR WO/CM   Basic metabolic panel   Lipid panel   EKG 12-Lead   ECHOCARDIOGRAM COMPLETE   Meds ordered this encounter  Medications   DISCONTD: metoprolol tartrate (LOPRESSOR) 100 MG tablet    Sig: TAKE 1 TABLET 2 HR PRIOR TO CARDIAC PROCEDURE    Dispense:  1 tablet    Refill:  0   metoprolol tartrate (LOPRESSOR) 100 MG tablet    Sig: TAKE 1 TABLET 2 HR PRIOR TO CARDIAC PROCEDURE    Dispense:  1 tablet    Refill:  0    Patient Instructions  Medication Instructions:  Your Physician recommend you continue on your current medication as directed.    *If you need a refill on your cardiac medications before your next appointment, please call your pharmacy*   Lab Work: Your  physician recommends lab work today (BMP, Lipid)  If you have labs (blood work) drawn today and your tests are completely normal, you will receive your results only by: MyChart Message (if you have MyChart) OR A paper copy in the mail If you have any lab test that is abnormal or we need to change your treatment, we will call you to review the results.   Testing/Procedures: Your physician has requested that you have an echocardiogram. Echocardiography is a painless test that uses sound waves to create images of your heart. It provides your doctor with information about the size and shape of your heart and how well your heart's chambers and valves are working. This procedure takes approximately one hour. There are no restrictions for this procedure. [address]  Cardiac CT Angiography (CTA), is a special type of CT scan that uses a computer to produce multi-dimensional views of major blood vessels throughout the body. In CT angiography, a contrast material is injected through an IV to help visualize the blood vessels [facility]    Follow-Up: At [facility], you and your health needs are our priority.  As part of our continuing mission to provide you with exceptional heart care, we have created designated Provider Care Teams.  These Care Teams include your primary Cardiologist (physician) and Advanced Practice Providers (APPs -  Physician Assistants and Nurse Practitioners) who all work together to provide you with the care you need, when you need it.  We recommend signing up for the patient portal called "MyChart".  Sign up information is provided on this After Visit Summary.  MyChart is used to connect with patients for Virtual Visits (Telemedicine).  Patients are able to view lab/test results, encounter notes, upcoming appointments, etc.  Non-urgent messages can be sent to your provider as well.   To learn more about what you can do with MyChart, go to  [email or web address].    Your next appointment:  As needed   The format for your next appointment:   In Person  Provider:   Reatha Harps, MD      Your cardiac CT will be scheduled at one of the below locations:   [facility] [address] [place], [place] [address] [phone]  If scheduled at [facility], please arrive at the [facility] and Children's Entrance (Entrance C2) of [facility] 30 minutes prior to test start time. You can use the FREE valet parking offered at entrance C (encouraged to control the heart rate for the test)  Proceed to the [facility] Radiology Department (first floor) to check-in and test prep.  All radiology patients and guests should use entrance C2 at [facility], accessed from [facility], even though the hospital's physical address listed is [address].    If scheduled at [facility], please arrive 15 mins early for check-in and test prep.  Please follow these instructions carefully (unless otherwise directed):  Hold all erectile dysfunction medications at least 3 days (72 hrs) prior to test.  On the Night Before the Test: Be sure to Drink plenty of water. Do not consume any caffeinated/decaffeinated beverages or chocolate 12 hours prior to your test. Do not take any antihistamines 12 hours prior to your test.  On the Day of the Test: Drink plenty of water until 1 hour prior to the test. Do not eat any food 4 hours prior to the test. You may take your regular medications prior to the test.  Take metoprolol (Lopressor) 100 mg two hours prior to test. FEMALES- please wear underwire-free bra if available, avoid dresses & tight clothing       After the Test: Drink plenty of water. After receiving IV contrast, you may experience a mild flushed feeling. This is normal. On occasion, you may experience a mild rash up to 24 hours after the test. This is  not dangerous. If this occurs, you can take Benadryl 25 mg and increase your fluid intake. If you experience trouble breathing, this can be serious. If it is severe call 911 IMMEDIATELY. If it is mild, please call our office. If you take any of these medications: Glipizide/Metformin, Avandament, Glucavance, please do not take 48 hours after completing test unless otherwise instructed.  We will call to schedule your test 2-4 weeks out understanding that some insurance companies will need an authorization prior to the service being performed.   For non-scheduling related questions, please contact the cardiac imaging nurse navigator should you have any questions/concerns: Rockwell Alexandria, Cardiac Imaging Nurse Navigator Larey Brick, Cardiac Imaging Nurse Navigator [facility] Direct Office Dial: [phone]   For scheduling needs, including cancellations and rescheduling, please call [place], [phone].         Signed, Lenna Gilford. Flora Lipps, MD, [facility]  [facility]  [address], [address] [place], [place] [address] [phone]  03/18/2022 12:51 PM

## 2022-03-18 ENCOUNTER — Encounter: Payer: Self-pay | Admitting: Cardiovascular Disease

## 2022-03-18 ENCOUNTER — Ambulatory Visit: Payer: Medicare Other | Admitting: Cardiovascular Disease

## 2022-03-18 VITALS — BP 136/74 | HR 81 | Ht 69.0 in | Wt 175.8 lb

## 2022-03-18 DIAGNOSIS — Z01812 Encounter for preprocedural laboratory examination: Secondary | ICD-10-CM | POA: Diagnosis not present

## 2022-03-18 DIAGNOSIS — R0602 Shortness of breath: Secondary | ICD-10-CM | POA: Diagnosis not present

## 2022-03-18 DIAGNOSIS — R072 Precordial pain: Secondary | ICD-10-CM | POA: Diagnosis not present

## 2022-03-18 DIAGNOSIS — I493 Ventricular premature depolarization: Secondary | ICD-10-CM | POA: Diagnosis not present

## 2022-03-18 DIAGNOSIS — I2510 Atherosclerotic heart disease of native coronary artery without angina pectoris: Secondary | ICD-10-CM | POA: Diagnosis not present

## 2022-03-18 MED ORDER — METOPROLOL TARTRATE 100 MG PO TABS: ORAL_TABLET | ORAL | 0 refills | Status: DC

## 2022-03-18 NOTE — Patient Instructions (Signed)
Medication Instructions:  Your Physician recommend you continue on your current medication as directed.    *If you need a refill on your cardiac medications before your next appointment, please call your pharmacy*   Lab Work: Your physician recommends lab work today (BMP, Lipid)  If you have labs (blood work) drawn today and your tests are completely normal, you will receive your results only by: MyChart Message (if you have MyChart) OR A paper copy in the mail If you have any lab test that is abnormal or we need to change your treatment, we will call you to review the results.   Testing/Procedures: Your physician has requested that you have an echocardiogram. Echocardiography is a painless test that uses sound waves to create images of your heart. It provides your doctor with information about the size and shape of your heart and how well your heart's chambers and valves are working. This procedure takes approximately one hour. There are no restrictions for this procedure. [place]  Cardiac CT Angiography (CTA), is a special type of CT scan that uses a computer to produce multi-dimensional views of major blood vessels throughout the body. In CT angiography, a contrast material is injected through an IV to help visualize the blood vessels [facility]    Follow-Up: At [facility], you and your health needs are our priority.  As part of our continuing mission to provide you with exceptional heart care, we have created designated Provider Care Teams.  These Care Teams include your primary Cardiologist (physician) and Advanced Practice Providers (APPs -  Physician Assistants and Nurse Practitioners) who all work together to provide you with the care you need, when you need it.  We recommend signing up for the patient portal called "MyChart".  Sign up information is provided on this After Visit Summary.  MyChart is used to connect with patients for Virtual  Visits (Telemedicine).  Patients are able to view lab/test results, encounter notes, upcoming appointments, etc.  Non-urgent messages can be sent to your provider as well.   To learn more about what you can do with MyChart, go to [email or web address].    Your next appointment:   As needed   The format for your next appointment:   In Person  Provider:   Evalina Field, MD      Your cardiac CT will be scheduled at one of the below locations:   [facility] [address] [place], [place] [address] [phone]  If scheduled at [facility], please arrive at the [facility] and Children's Entrance (Entrance C2) of [facility] 30 minutes prior to test start time. You can use the FREE valet parking offered at entrance C (encouraged to control the heart rate for the test)  Proceed to the [facility] Radiology Department (first floor) to check-in and test prep.  All radiology patients and guests should use entrance C2 at [facility], accessed from [facility], even though the hospital's physical address listed is [address].    If scheduled at [facility], please arrive 15 mins early for check-in and test prep.  Please follow these instructions carefully (unless otherwise directed):  Hold all erectile dysfunction medications at least 3 days (72 hrs) prior to test.  On the Night Before the Test: Be sure to Drink plenty of water. Do not consume any caffeinated/decaffeinated beverages or chocolate 12 hours prior to your test. Do not take any antihistamines  12 hours prior to your test.  On the Day of the Test: Drink plenty of water until 1 hour prior to the test. Do not eat any food 4 hours prior to the test. You may take your regular medications prior to the test.  Take metoprolol (Lopressor) 100 mg two hours prior to test. FEMALES- please wear underwire-free bra if available, avoid dresses & tight  clothing       After the Test: Drink plenty of water. After receiving IV contrast, you may experience a mild flushed feeling. This is normal. On occasion, you may experience a mild rash up to 24 hours after the test. This is not dangerous. If this occurs, you can take Benadryl 25 mg and increase your fluid intake. If you experience trouble breathing, this can be serious. If it is severe call 911 IMMEDIATELY. If it is mild, please call our office. If you take any of these medications: Glipizide/Metformin, Avandament, Glucavance, please do not take 48 hours after completing test unless otherwise instructed.  We will call to schedule your test 2-4 weeks out understanding that some insurance companies will need an authorization prior to the service being performed.   For non-scheduling related questions, please contact the cardiac imaging nurse navigator should you have any questions/concerns: Marchia Bond, Cardiac Imaging Nurse Navigator Gordy Clement, Cardiac Imaging Nurse Navigator [facility] Direct Office Dial: [phone]   For scheduling needs, including cancellations and rescheduling, please call [place], [phone].

## 2022-03-19 LAB — LIPID PANEL
Chol/HDL Ratio: 3.4 ratio (ref 0.0–4.4)
Cholesterol, Total: 185 mg/dL (ref 100–199)
HDL: 55 mg/dL (ref >39)
LDL Chol Calc (NIH): 111 mg/dL — ABNORMAL HIGH (ref 0–99)
Triglycerides: 106 mg/dL (ref 0–149)
VLDL Cholesterol Cal: 19 mg/dL (ref 5–40)

## 2022-03-19 LAB — BASIC METABOLIC PANEL
BUN/Creatinine Ratio: 16 (ref 12–28)
BUN: 13 mg/dL (ref 8–27)
CO2: 22 mmol/L (ref 20–29)
Calcium: 9.1 mg/dL (ref 8.7–10.3)
Chloride: 104 mmol/L (ref 96–106)
Creatinine, Ser: 0.81 mg/dL (ref 0.57–1.00)
Glucose: 90 mg/dL (ref 70–99)
Potassium: 5 mmol/L (ref 3.5–5.2)
Sodium: 142 mmol/L (ref 134–144)
eGFR: 74 mL/min/{1.73_m2} (ref >59)

## 2022-03-25 ENCOUNTER — Ambulatory Visit: Payer: Medicare Other | Admitting: Cardiology

## 2022-03-29 ENCOUNTER — Ambulatory Visit (HOSPITAL_COMMUNITY): Payer: Medicare Other | Attending: Cardiology

## 2022-03-29 DIAGNOSIS — R0602 Shortness of breath: Secondary | ICD-10-CM | POA: Insufficient documentation

## 2022-03-29 DIAGNOSIS — R072 Precordial pain: Secondary | ICD-10-CM | POA: Diagnosis not present

## 2022-03-31 LAB — ECHOCARDIOGRAM COMPLETE
Area-P 1/2: 3.65 cm2
MV M vel: 5.58 m/s
MV Peak grad: 124.5 mmHg
Radius: 0.5 cm
S' Lateral: 3.4 cm

## 2022-04-06 DIAGNOSIS — M791 Myalgia, unspecified site: Secondary | ICD-10-CM | POA: Diagnosis not present

## 2022-04-07 ENCOUNTER — Telehealth (HOSPITAL_COMMUNITY): Payer: Self-pay | Admitting: Emergency Medicine

## 2022-04-07 NOTE — Telephone Encounter (Signed)
Attempted to call patient regarding upcoming cardiac CT appointment. °Left message on voicemail with name and callback number °Kennedy Brines RN Navigator Cardiac Imaging °[facility] °[phone] Office °[phone] Cell ° °

## 2022-04-08 ENCOUNTER — Telehealth (HOSPITAL_COMMUNITY): Payer: Self-pay | Admitting: Emergency Medicine

## 2022-04-08 ENCOUNTER — Other Ambulatory Visit (HOSPITAL_COMMUNITY): Payer: Self-pay | Admitting: *Deleted

## 2022-04-08 ENCOUNTER — Telehealth (HOSPITAL_COMMUNITY): Payer: Self-pay | Admitting: *Deleted

## 2022-04-08 MED ORDER — METOPROLOL TARTRATE 100 MG PO TABS: ORAL_TABLET | ORAL | 0 refills | Status: DC

## 2022-04-08 NOTE — Telephone Encounter (Signed)
Reaching out to patient to offer assistance regarding upcoming cardiac imaging study; pt verbalizes understanding of appt date/time, parking situation and where to check in, pre-test NPO status and medications ordered, and verified current allergies; name and call back number provided for further questions should they arise ? ?Willem Klingensmith RN Navigator Cardiac Imaging ?[facility] ?[phone] office ?[phone] cell ? ?Patient to take 100mg metoprolol tartrate two hours prior to her cardiac CT scan.  She is aware to arrive at 1:30pm. ? ?

## 2022-04-08 NOTE — Telephone Encounter (Signed)
Attempted to call patient regarding upcoming cardiac CT appointment. °Left message on voicemail with name and callback number °Betzabeth Derringer RN Navigator Cardiac Imaging °[facility] °[phone] Office °[phone] Cell ° °

## 2022-04-09 ENCOUNTER — Ambulatory Visit (HOSPITAL_COMMUNITY)
Admission: RE | Admit: 2022-04-09 | Discharge: 2022-04-09 | Disposition: A | Discharge status: Home / Self Care | Payer: Medicare Other | Source: Ambulatory Visit | Attending: Cardiovascular Disease | Admitting: Cardiovascular Disease

## 2022-04-09 ENCOUNTER — Other Ambulatory Visit: Payer: Self-pay | Admitting: Cardiology

## 2022-04-09 DIAGNOSIS — R931 Abnormal findings on diagnostic imaging of heart and coronary circulation: Secondary | ICD-10-CM

## 2022-04-09 DIAGNOSIS — R072 Precordial pain: Secondary | ICD-10-CM | POA: Diagnosis not present

## 2022-04-09 DIAGNOSIS — I2510 Atherosclerotic heart disease of native coronary artery without angina pectoris: Secondary | ICD-10-CM

## 2022-04-09 MED ORDER — IOHEXOL 350 MG/ML SOLN
100.0000 mL | Freq: Once | INTRAVENOUS | Status: AC | PRN
  Administered 2022-04-09: 100 mL via INTRAVENOUS

## 2022-04-09 MED ORDER — NITROGLYCERIN 0.4 MG SL SUBL
SUBLINGUAL_TABLET | SUBLINGUAL | Status: AC
  Filled 2022-04-09: qty 2

## 2022-04-09 MED ORDER — NITROGLYCERIN 0.4 MG SL SUBL
0.8000 mg | SUBLINGUAL_TABLET | Freq: Once | SUBLINGUAL | Status: AC
  Administered 2022-04-09: 0.8 mg via SUBLINGUAL

## 2022-04-10 ENCOUNTER — Ambulatory Visit (HOSPITAL_COMMUNITY)
Admission: RE | Admit: 2022-04-10 | Discharge: 2022-04-10 | Disposition: A | Discharge status: Home / Self Care | Payer: Medicare Other | Source: Ambulatory Visit | Attending: Cardiology | Admitting: Cardiology

## 2022-04-10 DIAGNOSIS — R072 Precordial pain: Secondary | ICD-10-CM | POA: Diagnosis not present

## 2022-04-10 DIAGNOSIS — R931 Abnormal findings on diagnostic imaging of heart and coronary circulation: Secondary | ICD-10-CM | POA: Diagnosis not present

## 2022-04-12 ENCOUNTER — Other Ambulatory Visit: Payer: Self-pay

## 2022-04-12 ENCOUNTER — Telehealth: Payer: Self-pay | Admitting: Cardiovascular Disease

## 2022-04-12 MED ORDER — NITROGLYCERIN 0.4 MG SL SUBL: 0.4000 mg | SUBLINGUAL_TABLET | SUBLINGUAL | 3 refills | Status: DC | PRN

## 2022-04-12 MED ORDER — ASPIRIN 81 MG PO TBEC: 81.0000 mg | DELAYED_RELEASE_TABLET | Freq: Every day | ORAL | 3 refills | Status: DC

## 2022-04-12 MED ORDER — ATORVASTATIN CALCIUM 40 MG PO TABS: 40.0000 mg | ORAL_TABLET | Freq: Every day | ORAL | 3 refills | Status: DC

## 2022-04-12 MED ORDER — METOPROLOL TARTRATE 25 MG PO TABS: 25.0000 mg | ORAL_TABLET | Freq: Two times a day (BID) | ORAL | 3 refills | Status: DC

## 2022-04-12 NOTE — Telephone Encounter (Signed)
Attempted to call Alyssa Jacobs.  Coronary CTA with concerns for obstructive RCA lesion.  Needs to be started on metoprolol tartrate 25 mg twice daily, aspirin 81 mg daily, Crestor 40 mg daily.  Also needs a prescription for sublingual nitroglycerin.  She needs to see me in the office early next week as an overbook for discussion of left heart catheterization.  [place] B[place] T. Audie Box, MD, [place]  [address] [place], [place] [address] [phone]  2:58 PM

## 2022-04-12 NOTE — Telephone Encounter (Signed)
Patient returned call- I spoke with her, advised of results. She is scheduled to be seen with Dr.O'Neal when he is back in office next week - 08/01 at 1:20 PM patient verbalized understanding.   She was advised of medications (RX sent to pharmacy)

## 2022-04-19 NOTE — H&P (View-Only) (Signed)
Cardiology Office Note:   Date:  04/20/2022  NAME:  Alyssa Jacobs    MRN: [id number] DOB:  [birth year]   PCP:  Kathyrn Lass, MD  Cardiologist:  Evalina Field, MD  Electrophysiologist:  None   Referring MD: Kathyrn Lass, MD   Chief Complaint  Patient presents with   Follow-up        History of Present Illness:   Alyssa Jacobs is a 81 y.o. female with a hx of CAD who presents for follow-up.   She presents with her daughter.  She reports she is getting short of breath with laying flat.  She also reports low energy and fatigue.  She is not having any chest pain or chest tightness.  Her echocardiogram was normal.  Her EKG is normal.  We discussed her coronary CTA which shows an obstructive RCA lesion.  We discussed medications versus invasive intervention.  She reports she would like to proceed with invasive intervention.  I think this is reasonable.  Her energy level has been quite low.  Apparently she had COVID-19 infection and this contributed.  Her blood pressure slightly elevated.  She has not started her metoprolol yet.  She will start this.  We will set her up for heart cath.  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 Swallow whole.   atorvastatin (LIPITOR) 40 MG tablet Take 1 tablet (40 mg total) by mouth daily.    metoprolol tartrate (LOPRESSOR) 25 MG tablet Take 1 tablet (25 mg total) by mouth 2 (two) times daily.   nitroGLYCERIN (NITROSTAT) 0.4 MG SL tablet Place 1 tablet (0.4 mg total) under the tongue every 5 (five) minutes as needed for chest pain.   predniSONE (DELTASONE) 5 MG tablet Take 7.5 mg by mouth daily.   Vitamin D, Ergocalciferol, (DRISDOL) 1.25 MG (50000 UNIT) CAPS capsule TAKE 1 CAPSULE BY MOUTH 3 TIMES A WEEK     Allergies:    Codeine and Sulfa antibiotics   Social History: Social History   Socioeconomic History   Marital status: Divorced    Spouse name: Not on file   Number of children: 2   Years of education: Not on file   Highest education level: Not on file  Occupational History   Occupation: Retired [employer]   Smoking status: Former    Types: Cigarettes    Quit date: 04/10/2000    Years since quitting: 22.0   Smokeless tobacco: Never  Vaping Use   Vaping Use: Never used  Substance and Sexual Activity   Alcohol use: No   Drug use: No   Sexual activity: Not on file  Other Topics Concern   Not on file  Social History Narrative   Not on file  Social Determinants of Health   Financial Resource Strain: Not on file  Food Insecurity: Not on file  Transportation Needs: Not on file  Physical Activity: Not on file  Stress: Not on file  Social Connections: Not on file    Family History: The patient's family history includes Heart disease in her mother.  ROS:   All other ROS reviewed and negative. Pertinent positives noted in the HPI.     EKGs/Labs/Other Studies Reviewed:   The following studies were personally reviewed by me today:  EKG:  EKG is ordered today.  The ekg ordered today demonstrates normal sinus rhythm heart rate 75, nonspecific ST-T changes, and was personally reviewed by me.   TTE 03/29/2022  1. Left ventricular ejection fraction, by estimation, is 55 to 60%. The  left ventricle has normal function. The left ventricle has no  regional  wall motion abnormalities. Left ventricular diastolic parameters were  normal. The average left ventricular  global longitudinal strain is -22.6 %. The global longitudinal strain is  normal.   2. Right ventricular systolic function is normal. The right ventricular  size is normal. There is normal pulmonary artery systolic pressure.   3. Right atrial size was mildly dilated.   4. The mitral valve is normal in structure. Mild mitral valve  regurgitation. No evidence of mitral stenosis.   5. Tricuspid valve regurgitation is mild to moderate.   6. The aortic valve is tricuspid. There is mild calcification of the  aortic valve. Aortic valve regurgitation is not visualized. Aortic valve  sclerosis is present, with no evidence of aortic valve stenosis.   7. The inferior vena cava is normal in size with greater than 50%  respiratory variability, suggesting right atrial pressure of 3 mmHg.   CCTA 04/12/2022 IMPRESSION: 1. Severe CAD, CADRADS = 4. CT FFR will be performed and reported separately. Ramus with 70-99% ostial/proximal stenosis, LAD with 50-69% proximal stenosis, RCA with mid 70-99% stenosis.   2. Coronary calcium score of 226. This was 67th percentile for age and sex matched control.   3. Normal coronary origin with right dominance.  CT FFR 04/10/2022 1. Left Main:  No significant stenosis. FFR = 0.99 2. Ramus: Ostial stenosis with FFR 0.80 after lesion.   3. LAD: No significant stenosis. Proximal FFR = 0.97, Mid FFR = 0.91, Distal FFR = 0.81 4. LCX: No significant stenosis. Proximal FFR = 0.99, Distal FFR = 0.89 5. RCA: Significant focal stenosis in mid RCA. Proximal FFR = 0.94, Mid FFR = 0.60, Distal FFR = 0.58   IMPRESSION: 1. CT FFR analysis shows significant stenosis in the mid RCA, with FFR 0.60. Borderline stenosis in the ostial ramus, FFR 0.80. Results communicated to Dr. Audie Box.    Recent Labs: 03/18/2022: BUN 13; Creatinine, Ser 0.81; Potassium 5.0;  Sodium 142   Recent Lipid Panel    Component Value Date/Time   CHOL 185 03/18/2022 1203   TRIG 106 03/18/2022 1203   HDL 55 03/18/2022 1203   CHOLHDL 3.4 03/18/2022 1203   LDLCALC 111 (H) 03/18/2022 1203    Physical Exam:   VS:  BP (!) 144/68   Pulse 75   Ht '5\' 4"'$  (1.626 m)   Wt 177 lb (80.3 kg)   SpO2 99%   BMI 30.38 kg/m    Wt Readings from Last 3 Encounters:  04/20/22 177 lb (80.3 kg)  03/18/22 175 lb 12.8 oz (79.7 kg)  06/29/21 180 lb (81.6 kg)    General: Well nourished, well developed,  in no acute distress Head: Atraumatic, normal size  Eyes: PEERLA, EOMI  Neck: Supple, no JVD Endocrine: No thryomegaly Cardiac: Normal S1, S2; RRR; no murmurs, rubs, or gallops Lungs: Clear to auscultation bilaterally, no wheezing, rhonchi or rales  Abd: Soft, nontender, no hepatomegaly  Ext: No edema, pulses 2+ Musculoskeletal: No deformities, BUE and BLE strength normal and equal Skin: Warm and dry, no rashes   Neuro: Alert and oriented to person, place, time, and situation, CNII-XII grossly intact, no focal deficits  Psych: Normal mood and affect   ASSESSMENT:   Alyssa Jacobs is a 81 y.o. female who presents for the following: 1. Coronary artery disease involving native coronary artery of native heart with unstable angina pectoris ([place])   2. SOB (shortness of breath) on exertion     PLAN:   1. Coronary artery disease involving native coronary artery of native heart with unstable angina pectoris ([place]) 2. SOB (shortness of breath) on exertion -Found to have obstructive RCA lesion.  CT FFR 0.6.  EKG is nonischemic.  Echo is normal.  She continues to report low energy and shortness of breath with activity.  Shortness of breath can also occur with laying flat.  It is a bit difficult to delineate her symptoms as she has no chest tightness.  However given the severity of the stenosis I believe cardiac catheterization is warranted.  We will continue with aspirin 81 mg daily, Lipitor  40 mg daily, metoprolol tartrate 25 mg twice daily.  We discussed with her the risk and benefits of invasive angiography versus medical management.  She has opted for invasive strategy.  I think this is quite reasonable.  Her drop in FFR value is quite significant.  She was given a prescription for nitroglycerin.  She will see me back in 2 weeks after heart catheterization.   Shared Decision Making/Informed Consent The risks [stroke (1 in 1000), death (1 in 1000), kidney failure [usually temporary] (1 in 500), bleeding (1 in 200), allergic reaction [possibly serious] (1 in 200)], benefits (diagnostic support and management of coronary artery disease) and alternatives of a cardiac catheterization were discussed in detail with Ms. Albergo and she is willing to proceed.  Disposition: Return in about 1 month (around 05/21/2022).  Medication Adjustments/Labs and Tests Ordered: Current medicines are reviewed at length with the patient today.  Concerns regarding medicines are outlined above.  Orders Placed This Encounter  Procedures   Basic metabolic panel   CBC   EKG 12-Lead   No orders of the defined types were placed in this encounter.   Patient Instructions  Medication Instructions:  The current medical regimen is effective;  continue present plan and medications.  *If you need a refill on your cardiac medications before your next appointment, please call your pharmacy*   Lab Work: CBC, BMET today  If you have labs (blood work) drawn today and your tests are completely normal, you will receive your results only by: [place] (if you have MyChart) OR A paper copy in the mail If you have any lab test that is abnormal or we need to change your treatment, we will call you to review the results.   Testing/Procedures: Your physician has requested that you have a cardiac catheterization. Cardiac catheterization is used to diagnose and/or treat various heart conditions. Doctors may  recommend this procedure for a number of different reasons. The most common reason is to evaluate chest pain. Chest pain can be a symptom of coronary artery disease (CAD),  and cardiac catheterization can show whether plaque is narrowing or blocking your heart's arteries. This procedure is also used to evaluate the valves, as well as measure the blood flow and oxygen levels in different parts of your heart. For further information please visit [email or web address]. Please follow instruction sheet, as given.    Follow-Up: At [facility], you and your health needs are our priority.  As part of our continuing mission to provide you with exceptional heart care, we have created designated Provider Care Teams.  These Care Teams include your primary Cardiologist (physician) and Advanced Practice Providers (APPs -  Physician Assistants and Nurse Practitioners) who all work together to provide you with the care you need, when you need it.  We recommend signing up for the patient portal called "MyChart".  Sign up information is provided on this After Visit Summary.  MyChart is used to connect with patients for Virtual Visits (Telemedicine).  Patients are able to view lab/test results, encounter notes, upcoming appointments, etc.  Non-urgent messages can be sent to your provider as well.   To learn more about what you can do with MyChart, go to [email or web address].    Your next appointment:   2 week(s) post CATH   The format for your next appointment:   In Person  Provider:   Evalina Field, MD {     Other Instructions   [address] [place] [address] Dept: [phone] Loc: [place]  04/20/2022  You are scheduled for a Cardiac Catheterization on  August 17,   with Dr.Smith.  1. Please arrive at the Main Entrance A at [facility]: [place], [place] [address] at 5:30 (This time is two hours before your procedure to ensure your preparation). Free valet parking service is available.   Special note: Every effort is made to have your procedure done on time. Please understand that emergencies sometimes delay scheduled procedures.  2. Diet: Do not eat solid foods after midnight.  You may have clear liquids until 5 AM upon the day of the procedure.  3. Labs: You will need to have blood drawn today- CBC, BMET. You do not need to be fasting.  4. Medication instructions in preparation for your procedure:   Contrast Allergy: No  On the morning of your procedure, take Aspirin and any morning medicines NOT listed above.  You may use sips of water.  5. Plan to go home the same day, you will only stay overnight if medically necessary. 6. You MUST have a responsible adult to drive you home. 7. An adult MUST be with you the first 24 hours after you arrive home. 8. Bring a current list of your medications, and the last time and date medication taken. 9. Bring ID and current insurance cards. 10.Please wear clothes that are easy to get on and off and wear slip-on shoes.  Thank you for allowing [place] to care for you!   -- [facility]           Time Spent with Patient: I have spent a total of 35 minutes with patient reviewing hospital notes, telemetry, EKGs, labs and examining the patient as well as establishing an assessment and plan that was discussed with the patient.  > 50% of time was spent in direct patient care.  Signed, Addison Naegeli. Audie Box, MD, [place]  [address], [place], [place] [address] [phone]  04/20/2022 2:45 PM

## 2022-04-19 NOTE — Progress Notes (Unsigned)
Cardiology Office Note:   Date:  04/20/2022  NAME:  Alyssa Jacobs    MRN: [id number] DOB:  February 21, 1941   PCP:  Kathyrn Lass, MD  Cardiologist:  Evalina Field, MD  Electrophysiologist:  None   Referring MD: Kathyrn Lass, MD   Chief Complaint  Patient presents with   Follow-up        History of Present Illness:   Alyssa Jacobs is a 81 y.o. female with a hx of CAD who presents for follow-up.   She presents with her daughter.  She reports she is getting short of breath with laying flat.  She also reports low energy and fatigue.  She is not having any chest pain or chest tightness.  Her echocardiogram was normal.  Her EKG is normal.  We discussed her coronary CTA which shows an obstructive RCA lesion.  We discussed medications versus invasive intervention.  She reports she would like to proceed with invasive intervention.  I think this is reasonable.  Her energy level has been quite low.  Apparently she had COVID-19 infection and this contributed.  Her blood pressure slightly elevated.  She has not started her metoprolol yet.  She will start this.  We will set her up for heart cath.  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 Swallow whole.   atorvastatin (LIPITOR) 40 MG tablet Take 1 tablet (40 mg total) by mouth daily.    metoprolol tartrate (LOPRESSOR) 25 MG tablet Take 1 tablet (25 mg total) by mouth 2 (two) times daily.   nitroGLYCERIN (NITROSTAT) 0.4 MG SL tablet Place 1 tablet (0.4 mg total) under the tongue every 5 (five) minutes as needed for chest pain.   predniSONE (DELTASONE) 5 MG tablet Take 7.5 mg by mouth daily.   Vitamin D, Ergocalciferol, (DRISDOL) 1.25 MG (50000 UNIT) CAPS capsule TAKE 1 CAPSULE BY MOUTH 3 TIMES A WEEK     Allergies:    Codeine and Sulfa antibiotics   Social History: Social History   Socioeconomic History   Marital status: Divorced    Spouse name: Not on file   Number of children: 2   Years of education: Not on file   Highest education level: Not on file  Occupational History   Occupation: Retired [employer] Use   Smoking status: Former    Types: Cigarettes    Quit date: 04/10/2000    Years since quitting: 22.0   Smokeless tobacco: Never  Vaping Use   Vaping Use: Never used  Substance and Sexual Activity   Alcohol use: No   Drug use: No   Sexual activity: Not on file  Other Topics Concern   Not on file  Social History Narrative   Not on file  Social Determinants of Health   Financial Resource Strain: Not on file  Food Insecurity: Not on file  Transportation Needs: Not on file  Physical Activity: Not on file  Stress: Not on file  Social Connections: Not on file    Family History: The patient's family history includes Heart disease in her mother.  ROS:   All other ROS reviewed and negative. Pertinent positives noted in the HPI.     EKGs/Labs/Other Studies Reviewed:   The following studies were personally reviewed by me today:  EKG:  EKG is ordered today.  The ekg ordered today demonstrates normal sinus rhythm heart rate 75, nonspecific ST-T changes, and was personally reviewed by me.   TTE 03/29/2022  1. Left ventricular ejection fraction, by estimation, is 55 to 60%. The  left ventricle has normal function. The left ventricle has no  regional  wall motion abnormalities. Left ventricular diastolic parameters were  normal. The average left ventricular  global longitudinal strain is -22.6 %. The global longitudinal strain is  normal.   2. Right ventricular systolic function is normal. The right ventricular  size is normal. There is normal pulmonary artery systolic pressure.   3. Right atrial size was mildly dilated.   4. The mitral valve is normal in structure. Mild mitral valve  regurgitation. No evidence of mitral stenosis.   5. Tricuspid valve regurgitation is mild to moderate.   6. The aortic valve is tricuspid. There is mild calcification of the  aortic valve. Aortic valve regurgitation is not visualized. Aortic valve  sclerosis is present, with no evidence of aortic valve stenosis.   7. The inferior vena cava is normal in size with greater than 50%  respiratory variability, suggesting right atrial pressure of 3 mmHg.   CCTA 04/12/2022 IMPRESSION: 1. Severe CAD, CADRADS = 4. CT FFR will be performed and reported separately. Ramus with 70-99% ostial/proximal stenosis, LAD with 50-69% proximal stenosis, RCA with mid 70-99% stenosis.   2. Coronary calcium score of 226. This was 67th percentile for age and sex matched control.   3. Normal coronary origin with right dominance.  CT FFR 04/10/2022 1. Left Main:  No significant stenosis. FFR = 0.99 2. Ramus: Ostial stenosis with FFR 0.80 after lesion.   3. LAD: No significant stenosis. Proximal FFR = 0.97, Mid FFR = 0.91, Distal FFR = 0.81 4. LCX: No significant stenosis. Proximal FFR = 0.99, Distal FFR = 0.89 5. RCA: Significant focal stenosis in mid RCA. Proximal FFR = 0.94, Mid FFR = 0.60, Distal FFR = 0.58   IMPRESSION: 1. CT FFR analysis shows significant stenosis in the mid RCA, with FFR 0.60. Borderline stenosis in the ostial ramus, FFR 0.80. Results communicated to Dr. Audie Box.    Recent Labs: 03/18/2022: BUN 13; Creatinine, Ser 0.81; Potassium 5.0;  Sodium 142   Recent Lipid Panel    Component Value Date/Time   CHOL 185 03/18/2022 1203   TRIG 106 03/18/2022 1203   HDL 55 03/18/2022 1203   CHOLHDL 3.4 03/18/2022 1203   LDLCALC 111 (H) 03/18/2022 1203    Physical Exam:   VS:  BP (!) 144/68   Pulse 75   Ht '5\' 4"'$  (1.626 m)   Wt 177 lb (80.3 kg)   SpO2 99%   BMI 30.38 kg/m    Wt Readings from Last 3 Encounters:  04/20/22 177 lb (80.3 kg)  03/18/22 175 lb 12.8 oz (79.7 kg)  06/29/21 180 lb (81.6 kg)    General: Well nourished, well developed,  in no acute distress Head: Atraumatic, normal size  Eyes: PEERLA, EOMI  Neck: Supple, no JVD Endocrine: No thryomegaly Cardiac: Normal S1, S2; RRR; no murmurs, rubs, or gallops Lungs: Clear to auscultation bilaterally, no wheezing, rhonchi or rales  Abd: Soft, nontender, no hepatomegaly  Ext: No edema, pulses 2+ Musculoskeletal: No deformities, BUE and BLE strength normal and equal Skin: Warm and dry, no rashes   Neuro: Alert and oriented to person, place, time, and situation, CNII-XII grossly intact, no focal deficits  Psych: Normal mood and affect   ASSESSMENT:   Alyssa Jacobs is a 81 y.o. female who presents for the following: 1. Coronary artery disease involving native coronary artery of native heart with unstable angina pectoris ([place])   2. SOB (shortness of breath) on exertion     PLAN:   1. Coronary artery disease involving native coronary artery of native heart with unstable angina pectoris ([place]) 2. SOB (shortness of breath) on exertion -Found to have obstructive RCA lesion.  CT FFR 0.6.  EKG is nonischemic.  Echo is normal.  She continues to report low energy and shortness of breath with activity.  Shortness of breath can also occur with laying flat.  It is a bit difficult to delineate her symptoms as she has no chest tightness.  However given the severity of the stenosis I believe cardiac catheterization is warranted.  We will continue with aspirin 81 mg daily, Lipitor  40 mg daily, metoprolol tartrate 25 mg twice daily.  We discussed with her the risk and benefits of invasive angiography versus medical management.  She has opted for invasive strategy.  I think this is quite reasonable.  Her drop in FFR value is quite significant.  She was given a prescription for nitroglycerin.  She will see me back in 2 weeks after heart catheterization.   Shared Decision Making/Informed Consent The risks [stroke (1 in 1000), death (1 in 1000), kidney failure [usually temporary] (1 in 500), bleeding (1 in 200), allergic reaction [possibly serious] (1 in 200)], benefits (diagnostic support and management of coronary artery disease) and alternatives of a cardiac catheterization were discussed in detail with Ms. Skufca and she is willing to proceed.  Disposition: Return in about 1 month (around 05/21/2022).  Medication Adjustments/Labs and Tests Ordered: Current medicines are reviewed at length with the patient today.  Concerns regarding medicines are outlined above.  Orders Placed This Encounter  Procedures   Basic metabolic panel   CBC   EKG 12-Lead   No orders of the defined types were placed in this encounter.   Patient Instructions  Medication Instructions:  The current medical regimen is effective;  continue present plan and medications.  *If you need a refill on your cardiac medications before your next appointment, please call your pharmacy*   Lab Work: CBC, BMET today  If you have labs (blood work) drawn today and your tests are completely normal, you will receive your results only by: [place] (if you have MyChart) OR A paper copy in the mail If you have any lab test that is abnormal or we need to change your treatment, we will call you to review the results.   Testing/Procedures: Your physician has requested that you have a cardiac catheterization. Cardiac catheterization is used to diagnose and/or treat various heart conditions. Doctors may  recommend this procedure for a number of different reasons. The most common reason is to evaluate chest pain. Chest pain can be a symptom of coronary artery disease (CAD),  and cardiac catheterization can show whether plaque is narrowing or blocking your heart's arteries. This procedure is also used to evaluate the valves, as well as measure the blood flow and oxygen levels in different parts of your heart. For further information please visit [email or web address].[email or web address]. Please follow instruction sheet, as given.    Follow-Up: At [facility], you and your health needs are our priority.  As part of our continuing mission to provide you with exceptional heart care, we have created designated Provider Care Teams.  These Care Teams include your primary Cardiologist (physician) and Advanced Practice Providers (APPs -  Physician Assistants and Nurse Practitioners) who all work together to provide you with the care you need, when you need it.  We recommend signing up for the patient portal called "MyChart".  Sign up information is provided on this After Visit Summary.  MyChart is used to connect with patients for Virtual Visits (Telemedicine).  Patients are able to view lab/test results, encounter notes, upcoming appointments, etc.  Non-urgent messages can be sent to your provider as well.   To learn more about what you can do with MyChart, go to [email or web address].    Your next appointment:   2 week(s) post CATH   The format for your next appointment:   In Person  Provider:   Evalina Field, MD {     Other Instructions   [place] [address] Dept: [phone] Loc: [place]  04/20/2022  You are scheduled for a Cardiac Catheterization on  August 17,   with Dr.Smith.  1. Please arrive at the Main Entrance A at [facility]: [place], [place] [address] at 5:30 (This time is two hours before your procedure to ensure your preparation). Free valet parking service is available.   Special note: Every effort is made to have your procedure done on time. Please understand that emergencies sometimes delay scheduled procedures.  2. Diet: Do not eat solid foods after midnight.  You may have clear liquids until 5 AM upon the day of the procedure.  3. Labs: You will need to have blood drawn today- CBC, BMET. You do not need to be fasting.  4. Medication instructions in preparation for your procedure:   Contrast Allergy: No  On the morning of your procedure, take Aspirin and any morning medicines NOT listed above.  You may use sips of water.  5. Plan to go home the same day, you will only stay overnight if medically necessary. 6. You MUST have a responsible adult to drive you home. 7. An adult MUST be with you the first 24 hours after you arrive home. 8. Bring a current list of your medications, and the last time and date medication taken. 9. Bring ID and current insurance cards. 10.Please wear clothes that are easy to get on and off and wear slip-on shoes.  Thank you for allowing [place] to care for you!   -- [place] [facility]           Time Spent with Patient: I have spent a total of 35 minutes with patient reviewing hospital notes, telemetry, EKGs, labs and examining the patient as well as establishing an assessment and plan that was discussed with the patient.  > 50% of time was spent in direct patient care.  Signed, Addison Naegeli. Audie Box, MD, [place]  [address], [place], [place] [address] [phone]  04/20/2022 2:45 PM

## 2022-04-20 ENCOUNTER — Ambulatory Visit: Payer: Medicare Other | Admitting: Cardiovascular Disease

## 2022-04-20 ENCOUNTER — Encounter: Payer: Self-pay | Admitting: Cardiovascular Disease

## 2022-04-20 VITALS — BP 144/68 | HR 75 | Ht 64.0 in | Wt 177.0 lb

## 2022-04-20 DIAGNOSIS — R0602 Shortness of breath: Secondary | ICD-10-CM

## 2022-04-20 DIAGNOSIS — I25110 Atherosclerotic heart disease of native coronary artery with unstable angina pectoris: Secondary | ICD-10-CM

## 2022-04-20 DIAGNOSIS — I25118 Atherosclerotic heart disease of native coronary artery with other forms of angina pectoris: Secondary | ICD-10-CM

## 2022-04-20 NOTE — Patient Instructions (Addendum)
Medication Instructions:  The current medical regimen is effective;  continue present plan and medications.  *If you need a refill on your cardiac medications before your next appointment, please call your pharmacy*   Lab Work: CBC, BMET today  If you have labs (blood work) drawn today and your tests are completely normal, you will receive your results only by: [place] (if you have MyChart) OR A paper copy in the mail If you have any lab test that is abnormal or we need to change your treatment, we will call you to review the results.   Testing/Procedures: Your physician has requested that you have a cardiac catheterization. Cardiac catheterization is used to diagnose and/or treat various heart conditions. Doctors may recommend this procedure for a number of different reasons. The most common reason is to evaluate chest pain. Chest pain can be a symptom of coronary artery disease (CAD), and cardiac catheterization can show whether plaque is narrowing or blocking your heart's arteries. This procedure is also used to evaluate the valves, as well as measure the blood flow and oxygen levels in different parts of your heart. For further information please visit [email or web address]. Please follow instruction sheet, as given.    Follow-Up: At [facility], you and your health needs are our priority.  As part of our continuing mission to provide you with exceptional heart care, we have created designated Provider Care Teams.  These Care Teams include your primary Cardiologist (physician) and Advanced Practice Providers (APPs -  Physician Assistants and Nurse Practitioners) who all work together to provide you with the care you need, when you need it.  We recommend signing up for the patient portal called "MyChart".  Sign up information is provided on this After Visit Summary.  MyChart is used to connect with patients for Virtual Visits (Telemedicine).  Patients are able to view lab/test  results, encounter notes, upcoming appointments, etc.  Non-urgent messages can be sent to your provider as well.   To learn more about what you can do with MyChart, go to [email or web address].    Your next appointment:   2 week(s) post CATH   The format for your next appointment:   In Person  Provider:   Evalina Field, MD {     Other Instructions   [place] [address] Dept: [phone] Loc: [place]  04/20/2022  You are scheduled for a Cardiac Catheterization on  August 17,   with Dr.Smith.  1. Please arrive at the Main Entrance A at [facility]: [place], [place] [address] at 5:30 (This time is two hours before your procedure to ensure your preparation). Free valet parking service is available.   Special note: Every effort is made to have your procedure done on time. Please understand that emergencies sometimes delay scheduled procedures.  2. Diet: Do not eat solid foods after midnight.  You may have clear liquids until 5 AM upon the day of the procedure.  3. Labs: You will need to have blood drawn today- CBC, BMET. You do not need to be fasting.  4. Medication instructions in preparation for your procedure:   Contrast Allergy: No  On the morning of your procedure, take Aspirin and any morning medicines NOT listed above.  You may use sips of water.  5. Plan to go home the same day, you will only stay overnight if medically necessary. 6.  You MUST have a responsible adult to drive you home. 7. An adult MUST be with you the first 24 hours after you arrive home. 8. Bring a current list of your medications, and the last time and date medication taken. 9. Bring ID and current insurance cards. 10.Please wear clothes that are easy to get on and off and wear slip-on shoes.  Thank you for allowing [place] to care for  you!   -- [place] [facility]

## 2022-04-21 LAB — BASIC METABOLIC PANEL
BUN/Creatinine Ratio: 19 (ref 12–28)
BUN: 18 mg/dL (ref 8–27)
CO2: 23 mmol/L (ref 20–29)
Calcium: 9.4 mg/dL (ref 8.7–10.3)
Chloride: 102 mmol/L (ref 96–106)
Creatinine, Ser: 0.95 mg/dL (ref 0.57–1.00)
Glucose: 97 mg/dL (ref 70–99)
Potassium: 5.3 mmol/L — ABNORMAL HIGH (ref 3.5–5.2)
Sodium: 141 mmol/L (ref 134–144)
eGFR: 61 mL/min/{1.73_m2} (ref >59)

## 2022-04-21 LAB — CBC
Hematocrit: 42.1 % (ref 34.0–46.6)
Hemoglobin: 13.9 g/dL (ref 11.1–15.9)
MCH: 28.7 pg (ref 26.6–33.0)
MCHC: 33 g/dL (ref 31.5–35.7)
MCV: 87 fL (ref 79–97)
Platelets: 280 10*3/uL (ref 150–450)
RBC: 4.85 x10E6/uL (ref 3.77–5.28)
RDW: 12.5 % (ref 11.7–15.4)
WBC: 7.5 10*3/uL (ref 3.4–10.8)

## 2022-04-27 DIAGNOSIS — L821 Other seborrheic keratosis: Secondary | ICD-10-CM | POA: Diagnosis not present

## 2022-04-27 DIAGNOSIS — L230 Allergic contact dermatitis due to metals: Secondary | ICD-10-CM | POA: Diagnosis not present

## 2022-04-27 DIAGNOSIS — L570 Actinic keratosis: Secondary | ICD-10-CM | POA: Diagnosis not present

## 2022-04-27 DIAGNOSIS — L820 Inflamed seborrheic keratosis: Secondary | ICD-10-CM | POA: Diagnosis not present

## 2022-04-27 DIAGNOSIS — D692 Other nonthrombocytopenic purpura: Secondary | ICD-10-CM | POA: Diagnosis not present

## 2022-04-27 DIAGNOSIS — D1801 Hemangioma of skin and subcutaneous tissue: Secondary | ICD-10-CM | POA: Diagnosis not present

## 2022-04-27 DIAGNOSIS — L400 Psoriasis vulgaris: Secondary | ICD-10-CM | POA: Diagnosis not present

## 2022-05-04 DIAGNOSIS — M791 Myalgia, unspecified site: Secondary | ICD-10-CM | POA: Diagnosis not present

## 2022-05-04 DIAGNOSIS — R911 Solitary pulmonary nodule: Secondary | ICD-10-CM | POA: Diagnosis not present

## 2022-05-04 DIAGNOSIS — R9389 Abnormal findings on diagnostic imaging of other specified body structures: Secondary | ICD-10-CM | POA: Diagnosis not present

## 2022-05-05 ENCOUNTER — Telehealth: Payer: Self-pay | Admitting: *Deleted

## 2022-05-05 NOTE — Telephone Encounter (Addendum)
Cardiac Catheterization scheduled at [facility] for: Thursday May 06, 2022 7:30 AM Arrival time and place: [place] Entrance A at: 5:30 AM   Nothing to eat after midnight prior to procedure, clear liquids until 5 AM day of procedure.  Medication instructions: -Usual morning medications can be taken with sips of water including aspirin 81 mg.  Confirmed patient has responsible adult to drive home post procedure and be with patient first 24 hours after arriving home.  Patient reports no new symptoms concerning for COVID-19 in the past 10 days.  Reviewed procedure instructions with patient's daughter, Sharee Pimple, with patient's permission.

## 2022-05-05 NOTE — H&P (Signed)
Dyspnea on exertion, likely an anginal equivalent. Coronary CTA FFR severe stenosis in the mid right coronary.  RCA appears to be diffusely diseased. She is on moderate medical therapy for ischemia.  She is on statin therapy. Cath and possible RCA PCI with stent

## 2022-05-06 ENCOUNTER — Other Ambulatory Visit (HOSPITAL_COMMUNITY): Payer: Self-pay

## 2022-05-06 ENCOUNTER — Other Ambulatory Visit: Payer: Self-pay

## 2022-05-06 ENCOUNTER — Encounter (HOSPITAL_COMMUNITY)
Admission: RE | Disposition: A | Discharge status: Home / Self Care | Payer: Self-pay | Source: Home / Self Care | Attending: Interventional Cardiology

## 2022-05-06 ENCOUNTER — Ambulatory Visit (HOSPITAL_COMMUNITY)
Admission: RE | Admit: 2022-05-06 | Discharge: 2022-05-07 | Disposition: A | Discharge status: Home / Self Care | Payer: Medicare Other | Attending: Interventional Cardiology | Admitting: Interventional Cardiology

## 2022-05-06 DIAGNOSIS — I25118 Atherosclerotic heart disease of native coronary artery with other forms of angina pectoris: Secondary | ICD-10-CM | POA: Diagnosis not present

## 2022-05-06 DIAGNOSIS — Z955 Presence of coronary angioplasty implant and graft: Secondary | ICD-10-CM | POA: Diagnosis not present

## 2022-05-06 DIAGNOSIS — I2510 Atherosclerotic heart disease of native coronary artery without angina pectoris: Secondary | ICD-10-CM | POA: Diagnosis present

## 2022-05-06 DIAGNOSIS — I25110 Atherosclerotic heart disease of native coronary artery with unstable angina pectoris: Secondary | ICD-10-CM | POA: Diagnosis not present

## 2022-05-06 DIAGNOSIS — E785 Hyperlipidemia, unspecified: Secondary | ICD-10-CM

## 2022-05-06 HISTORY — PX: LEFT HEART CATH AND CORONARY ANGIOGRAPHY: CATH118249

## 2022-05-06 HISTORY — PX: CORONARY STENT INTERVENTION: CATH118234

## 2022-05-06 HISTORY — PX: INTRAVASCULAR ULTRASOUND/IVUS: CATH118244

## 2022-05-06 LAB — POCT I-STAT, CHEM 8
BUN: 20 mg/dL (ref 8–23)
Calcium, Ion: 1.2 mmol/L (ref 1.15–1.40)
Chloride: 107 mmol/L (ref 98–111)
Creatinine, Ser: 0.8 mg/dL (ref 0.44–1.00)
Glucose, Bld: 123 mg/dL — ABNORMAL HIGH (ref 70–99)
HCT: 38 % (ref 36.0–46.0)
Hemoglobin: 12.9 g/dL (ref 12.0–15.0)
Potassium: 4.3 mmol/L (ref 3.5–5.1)
Sodium: 140 mmol/L (ref 135–145)
TCO2: 21 mmol/L — ABNORMAL LOW (ref 22–32)

## 2022-05-06 LAB — POCT ACTIVATED CLOTTING TIME
Activated Clotting Time: 287 seconds
Activated Clotting Time: 311 seconds

## 2022-05-06 SURGERY — LEFT HEART CATH AND CORONARY ANGIOGRAPHY
Anesthesia: LOCAL

## 2022-05-06 MED ORDER — VERAPAMIL HCL 2.5 MG/ML IV SOLN
INTRAVENOUS | Status: DC | PRN
  Administered 2022-05-06: 10 mL via INTRA_ARTERIAL

## 2022-05-06 MED ORDER — IOHEXOL 350 MG/ML SOLN
INTRAVENOUS | Status: DC | PRN
  Administered 2022-05-06: 130 mL

## 2022-05-06 MED ORDER — CLOPIDOGREL BISULFATE 75 MG PO TABS
75.0000 mg | ORAL_TABLET | Freq: Every day | ORAL | 3 refills | Status: DC
  Filled 2022-05-06: qty 90, 90d supply, fill #0

## 2022-05-06 MED ORDER — SODIUM CHLORIDE 0.9% FLUSH: 3.0000 mL | INTRAVENOUS | Status: DC | PRN

## 2022-05-06 MED ORDER — SODIUM CHLORIDE 0.9 % IV SOLN: 250.0000 mL | INTRAVENOUS | Status: DC | PRN

## 2022-05-06 MED ORDER — LIDOCAINE HCL (PF) 1 % IJ SOLN
INTRAMUSCULAR | Status: DC | PRN
  Administered 2022-05-06: 2 mL

## 2022-05-06 MED ORDER — NITROGLYCERIN 1 MG/10 ML FOR IR/CATH LAB
INTRA_ARTERIAL | Status: AC
  Filled 2022-05-06: qty 10

## 2022-05-06 MED ORDER — ASPIRIN 81 MG PO TBEC
81.0000 mg | DELAYED_RELEASE_TABLET | Freq: Every day | ORAL | 1 refills | Status: DC
  Filled 2022-05-06: qty 30, 30d supply, fill #0

## 2022-05-06 MED ORDER — CLOPIDOGREL BISULFATE 75 MG PO TABS
ORAL_TABLET | ORAL | Status: AC
  Filled 2022-05-06: qty 8

## 2022-05-06 MED ORDER — VERAPAMIL HCL 2.5 MG/ML IV SOLN
INTRAVENOUS | Status: AC
  Filled 2022-05-06: qty 2

## 2022-05-06 MED ORDER — MIDAZOLAM HCL 2 MG/2ML IJ SOLN
INTRAMUSCULAR | Status: AC
  Filled 2022-05-06: qty 2

## 2022-05-06 MED ORDER — SODIUM CHLORIDE 0.9% FLUSH
3.0000 mL | Freq: Two times a day (BID) | INTRAVENOUS | Status: DC
  Administered 2022-05-06 – 2022-05-07 (×2): 3 mL via INTRAVENOUS

## 2022-05-06 MED ORDER — FENTANYL CITRATE (PF) 100 MCG/2ML IJ SOLN
INTRAMUSCULAR | Status: DC | PRN
  Administered 2022-05-06 (×2): 25 ug via INTRAVENOUS

## 2022-05-06 MED ORDER — ATORVASTATIN CALCIUM 40 MG PO TABS
40.0000 mg | ORAL_TABLET | Freq: Every day | ORAL | Status: DC
  Administered 2022-05-06 – 2022-05-07 (×2): 40 mg via ORAL
  Filled 2022-05-06 (×2): qty 1

## 2022-05-06 MED ORDER — LABETALOL HCL 5 MG/ML IV SOLN: 10.0000 mg | INTRAVENOUS | Status: AC | PRN

## 2022-05-06 MED ORDER — ASPIRIN 81 MG PO CHEW: 81.0000 mg | CHEWABLE_TABLET | ORAL | Status: DC

## 2022-05-06 MED ORDER — NITROGLYCERIN 1 MG/10 ML FOR IR/CATH LAB
INTRA_ARTERIAL | Status: DC | PRN
  Administered 2022-05-06 (×2): 100 ug via INTRACORONARY

## 2022-05-06 MED ORDER — MIDAZOLAM HCL 2 MG/2ML IJ SOLN
INTRAMUSCULAR | Status: DC | PRN
  Administered 2022-05-06 (×2): 1 mg via INTRAVENOUS

## 2022-05-06 MED ORDER — HEPARIN SODIUM (PORCINE) 1000 UNIT/ML IJ SOLN
INTRAMUSCULAR | Status: AC
  Filled 2022-05-06: qty 10

## 2022-05-06 MED ORDER — SODIUM CHLORIDE 0.9 % IV SOLN: INTRAVENOUS | Status: AC

## 2022-05-06 MED ORDER — SODIUM CHLORIDE 0.9 % WEIGHT BASED INFUSION
3.0000 mL/kg/h | INTRAVENOUS | Status: DC
Start: 2022-05-06 — End: 2022-05-06
  Administered 2022-05-06: 3 mL/kg/h via INTRAVENOUS

## 2022-05-06 MED ORDER — SODIUM CHLORIDE 0.9 % WEIGHT BASED INFUSION: 1.0000 mL/kg/h | INTRAVENOUS | Status: DC

## 2022-05-06 MED ORDER — HEPARIN (PORCINE) IN NACL 1000-0.9 UT/500ML-% IV SOLN
INTRAVENOUS | Status: DC | PRN
  Administered 2022-05-06 (×3): 500 mL

## 2022-05-06 MED ORDER — HEPARIN SODIUM (PORCINE) 1000 UNIT/ML IJ SOLN
INTRAMUSCULAR | Status: DC | PRN
  Administered 2022-05-06: 4000 [IU] via INTRAVENOUS
  Administered 2022-05-06: 6000 [IU] via INTRAVENOUS
  Administered 2022-05-06: 2000 [IU] via INTRAVENOUS

## 2022-05-06 MED ORDER — SODIUM CHLORIDE 0.9% FLUSH: 3.0000 mL | Freq: Two times a day (BID) | INTRAVENOUS | Status: DC

## 2022-05-06 MED ORDER — CLOPIDOGREL BISULFATE 75 MG PO TABS
ORAL_TABLET | ORAL | Status: DC | PRN
  Administered 2022-05-06: 600 mg via ORAL

## 2022-05-06 MED ORDER — METOPROLOL TARTRATE 25 MG PO TABS
25.0000 mg | ORAL_TABLET | Freq: Two times a day (BID) | ORAL | Status: DC
  Administered 2022-05-06 – 2022-05-07 (×2): 25 mg via ORAL
  Filled 2022-05-06 (×2): qty 1

## 2022-05-06 MED ORDER — ASPIRIN 81 MG PO TBEC: 81.0000 mg | DELAYED_RELEASE_TABLET | Freq: Every day | ORAL | 1 refills | Status: DC

## 2022-05-06 MED ORDER — HEPARIN (PORCINE) IN NACL 1000-0.9 UT/500ML-% IV SOLN
INTRAVENOUS | Status: AC
  Filled 2022-05-06: qty 500

## 2022-05-06 MED ORDER — ASPIRIN 81 MG PO CHEW
81.0000 mg | CHEWABLE_TABLET | Freq: Every day | ORAL | Status: DC
  Administered 2022-05-07: 81 mg via ORAL
  Filled 2022-05-06: qty 1

## 2022-05-06 MED ORDER — LIDOCAINE HCL (PF) 1 % IJ SOLN
INTRAMUSCULAR | Status: AC
  Filled 2022-05-06: qty 30

## 2022-05-06 MED ORDER — SODIUM CHLORIDE 0.9 % IV SOLN
250.0000 mL | INTRAVENOUS | Status: DC | PRN
Start: 2022-05-06 — End: 2022-05-06

## 2022-05-06 MED ORDER — ONDANSETRON HCL 4 MG/2ML IJ SOLN: 4.0000 mg | Freq: Four times a day (QID) | INTRAMUSCULAR | Status: DC | PRN

## 2022-05-06 MED ORDER — ACETAMINOPHEN 325 MG PO TABS: 650.0000 mg | ORAL_TABLET | ORAL | Status: DC | PRN

## 2022-05-06 MED ORDER — CLOPIDOGREL BISULFATE 75 MG PO TABS
75.0000 mg | ORAL_TABLET | Freq: Every day | ORAL | Status: DC
  Administered 2022-05-07: 75 mg via ORAL
  Filled 2022-05-06: qty 1

## 2022-05-06 MED ORDER — OXYCODONE HCL 5 MG PO TABS: 5.0000 mg | ORAL_TABLET | ORAL | Status: DC | PRN

## 2022-05-06 MED ORDER — HEPARIN (PORCINE) IN NACL 1000-0.9 UT/500ML-% IV SOLN
INTRAVENOUS | Status: AC
  Filled 2022-05-06: qty 1000

## 2022-05-06 MED ORDER — FENTANYL CITRATE (PF) 100 MCG/2ML IJ SOLN
INTRAMUSCULAR | Status: AC
  Filled 2022-05-06: qty 2

## 2022-05-06 MED ORDER — HYDRALAZINE HCL 20 MG/ML IJ SOLN: 10.0000 mg | INTRAMUSCULAR | Status: AC | PRN

## 2022-05-06 SURGICAL SUPPLY — 23 items
BALL SAPPHIRE NC24 3.25X15 (BALLOONS) ×1
BALLN SAPPHIRE 2.0X12 (BALLOONS) ×1
BALLOON SAPPHIRE 2.0X12 (BALLOONS) IMPLANT
BALLOON SAPPHIRE NC24 3.25X15 (BALLOONS) IMPLANT
CATH 5FR JL3.5 JR4 ANG PIG MP (CATHETERS) IMPLANT
CATH OPTICROSS HD (CATHETERS) IMPLANT
CATH VISTA GUIDE 6FR JR4 (CATHETERS) IMPLANT
CATH VISTA GUIDE 6FR XBRCA (CATHETERS) IMPLANT
DEVICE RAD COMP TR BAND LRG (VASCULAR PRODUCTS) IMPLANT
ELECT DEFIB PAD ADLT CADENCE (PAD) IMPLANT
GLIDESHEATH SLEND A-KIT 6F 22G (SHEATH) IMPLANT
GUIDEWIRE INQWIRE 1.5J.035X260 (WIRE) IMPLANT
INQWIRE 1.5J .035X260CM (WIRE) ×1
KIT ENCORE 26 ADVANTAGE (KITS) IMPLANT
KIT HEART LEFT (KITS) ×2 IMPLANT
PACK CARDIAC CATHETERIZATION (CUSTOM PROCEDURE TRAY) ×2 IMPLANT
SHEATH PROBE COVER 6X72 (BAG) IMPLANT
SLED PULL BACK IVUS (MISCELLANEOUS) IMPLANT
STENT SYNERGY XD 3.0X32 (Permanent Stent) IMPLANT
TRANSDUCER W/STOPCOCK (MISCELLANEOUS) ×2 IMPLANT
TUBING CIL FLEX 10 FLL-RA (TUBING) ×2 IMPLANT
WIRE ASAHI PROWATER 180CM (WIRE) IMPLANT
WIRE HI TORQ VERSACORE-J 145CM (WIRE) IMPLANT

## 2022-05-06 NOTE — Progress Notes (Signed)
NP Ria Comment was page. Daughter was concerned about patient acting different. Patient will be admitted to hospital overnight for observation.

## 2022-05-06 NOTE — Progress Notes (Signed)
    General: Well developed, well nourished, older female appearing in no acute distress. Sitting up in the chair.  Head: Normocephalic, atraumatic. PERRLA  Lungs:  Resp regular and unlabored, CTA. Heart: RRR, S1, S2, no S3, S4, or murmur; no rub. Abdomen: Soft, non-tender, non-distended with normoactive bowel sounds. Extremities: No clubbing, cyanosis, edema. Distal pedal pulses are 2+ bilaterally. Right radial cath site stable with bruising but no hematoma Neuro:  Language: speech is clear and intact RUE/LUE:  grip   5/5, right slightly limited given radial stick site     RLE/LLE:    plantar flexion   5/5  dorsiflexion   5/5        Sensation: Intact to touch on bilateral upper/lower extremities Psych: Normal affect.  Patient remains somewhat lethargic, but alert/oriented. Follows commands appropriately. No focal neurological deficits on exam or visual changes. She knows where she is and that she had a cardiac cath today. Was able to get up to the bathroom with nursing staff with no unilateral weakness. Suspect this is related to procedural fentanyl/versed received for cath today. Will observe overnight to ensure no post procedural complications arise. Updated intervential MD regarding plans to admit overnight.   SignedReino Bellis, NP-C 05/06/2022, 3:36 PM Pager: [phone]

## 2022-05-06 NOTE — CV Procedure (Signed)
60 to 70% proximal RCA and 99% mid RCA.  Distal vessel fills by left-to-right collaterals. Left main coronary is widely patent. LAD is large and transapical.  The mid to distal LAD after the third diagonal contains diffuse segmental 60% stenosis. Small to moderate size ostial to proximal 75 to 80% ramus intermedius. Circumflex is large and gives origin to a large trifurcating obtuse marginal and there are well-formed collaterals to the left ventricular branches of the right coronary. Successful IVUS guided stenting of the proximal to mid RCA reducing 70% and 99% stenoses to 0% with TIMI grade III flow using a 33 x 3.0 Synergy postdilated to 3.25 mm in diameter. Plan discharge later today if no complications.

## 2022-05-06 NOTE — Progress Notes (Signed)
At 11:40 am notice increase in bruising around TR Band. Pressure applied to both sides of the TR band for 20 minutes. No increase in bruising noted.

## 2022-05-06 NOTE — Interval H&P Note (Signed)
Cath Lab Visit (complete for each Cath Lab visit)  Clinical Evaluation Leading to the Procedure:   ACS: No.  Non-ACS:    Anginal Classification: CCS III  Anti-ischemic medical therapy: Maximal Therapy (2 or more classes of medications)  Non-Invasive Test Results: High-risk stress test findings: cardiac mortality >3%/year  Prior CABG: No previous CABG      History and Physical Interval Note:  05/06/2022 7:35 AM  [place]  has presented today for surgery, with the diagnosis of unstable angina.  The various methods of treatment have been discussed with the patient and family. After consideration of risks, benefits and other options for treatment, the patient has consented to  Procedure(s): LEFT HEART CATH AND CORONARY ANGIOGRAPHY (N/A) as a surgical intervention.  The patient's history has been reviewed, patient examined, no change in status, stable for surgery.  I have reviewed the patient's chart and labs.  Questions were answered to the patient's satisfaction.     Belva Crome III

## 2022-05-06 NOTE — Progress Notes (Signed)
Report was called to [place]. Waiting for room to get cleaned.

## 2022-05-06 NOTE — Progress Notes (Signed)
CARDIAC REHAB PHASE I       Pt resting in chair with daughter present. Post stent education including risk factors, antiplatelet therapy importance, exercise guidelines, restrictions, site care, heart healthy diet, and CRP2. Will refer to [facility] for CRP2. All questions and concerns addressed.  Prior to leaving room pt began vomiting moderate amount of brownish/tan vomit and appeared confused as to who her daughter is, calling her mom instead of daughter. She was able to tell me where she was and why. Her daughter felt she had been acting a little off the last hour or so. Adonis Huguenin pt short stay RN notified of this new status and will notify MD.   [phone]  Vanessa Barbara, RN BSN 05/06/2022 3:04 PM

## 2022-05-06 NOTE — Progress Notes (Signed)
Patient could not open left hand.  Grip to right and left hand was moderate. Not able to see fingers move in her peripheral of left eye.  Dr. Tamala Julian called to chair side to evaluate.

## 2022-05-06 NOTE — Discharge Summary (Addendum)
Deleted

## 2022-05-07 ENCOUNTER — Encounter (HOSPITAL_COMMUNITY): Payer: Self-pay | Admitting: Interventional Cardiology

## 2022-05-07 DIAGNOSIS — I209 Angina pectoris, unspecified: Secondary | ICD-10-CM | POA: Diagnosis not present

## 2022-05-07 DIAGNOSIS — I25110 Atherosclerotic heart disease of native coronary artery with unstable angina pectoris: Secondary | ICD-10-CM | POA: Diagnosis not present

## 2022-05-07 DIAGNOSIS — Z955 Presence of coronary angioplasty implant and graft: Secondary | ICD-10-CM | POA: Diagnosis not present

## 2022-05-07 NOTE — Progress Notes (Signed)
CARDIAC REHAB PHASE I     Pt feeling good this morning. No further problems with confusion overnight. Pt states she has been ambulating around room with no problems. Plan for discharge home today. Daughter in room with pt, reviewed post stent education provided yesterday, all questions and concerns addressed.   [id number]  Vanessa Barbara, RN BSN 05/07/2022 10:19 AM

## 2022-05-07 NOTE — Discharge Summary (Addendum)
Discharge Summary    Patient ID: Alyssa Jacobs MRN: [id number]; DOB: 01-08-1941  Admit date: 05/06/2022 Discharge date: 05/07/2022  PCP:  Kathyrn Lass, MD   [facility] Providers Cardiologist:  Evalina Field, MD   {   Discharge Diagnoses    Principal Problem:   CAD (coronary artery disease) Active Problems:   CAD (coronary artery disease), native coronary artery   Hyperlipidemia LDL goal <70    Diagnostic Studies/Procedures    LHC from 05/06/22:   Intervention   _____________ CONCLUSIONS: 70% followed by 99% proximal to mid RCA treated with 32 x 3.0 Synergy postdilated to 3.25 mmHg and high-pressure reducing stenosis to 0% with maintenance of TIMI grade III flow.  No complications. Left main is widely patent. LAD is widely patent and wraps around the apex.  There is mid to distal segmental 60% narrowing after the third diagonal. Ramus intermedius small to moderate in size and contains ostial to proximal 70 to 80% narrowing. Circumflex gives origin to a large branching obtuse marginal.  Distal circumflex supplies collaterals to the distal RCA. Normal LV function with normal LVEDP.  EF greater than 65%.   RECOMMENDATIONS:   Aspirin and and clopidogrel dual antiplatelet therapy x6 months then de-escalate to clopidogrel 75 mg/day thereafter. Aggressive risk factor modification Outpatient status  CTA coronary study 04/09/22:    1. Left Main:  No significant stenosis. FFR = 0.99 2. Ramus: Ostial stenosis with FFR 0.80 after lesion.   3. LAD: No significant stenosis. Proximal FFR = 0.97, Mid FFR = 0.91, Distal FFR = 0.81 4. LCX: No significant stenosis. Proximal FFR = 0.99, Distal FFR = 0.89 5. RCA: Significant focal stenosis in mid RCA. Proximal FFR = 0.94, Mid FFR = 0.60, Distal FFR = 0.58   IMPRESSION: 1. CT FFR analysis shows significant stenosis in the mid RCA, with FFR 0.60. Borderline stenosis in the ostial ramus, FFR 0.80. Results communicated to  Dr. Audie Box.   Echo from 03/29/22:   1. Left ventricular ejection fraction, by estimation, is 55 to 60%. The  left ventricle has normal function. The left ventricle has no regional  wall motion abnormalities. Left ventricular diastolic parameters were  normal. The average left ventricular  global longitudinal strain is -22.6 %. The global longitudinal strain is  normal.   2. Right ventricular systolic function is normal. The right ventricular  size is normal. There is normal pulmonary artery systolic pressure.   3. Right atrial size was mildly dilated.   4. The mitral valve is normal in structure. Mild mitral valve  regurgitation. No evidence of mitral stenosis.   5. Tricuspid valve regurgitation is mild to moderate.   6. The aortic valve is tricuspid. There is mild calcification of the  aortic valve. Aortic valve regurgitation is not visualized. Aortic valve  sclerosis is present, with no evidence of aortic valve stenosis.   7. The inferior vena cava is normal in size with greater than 50%  respiratory variability, suggesting right atrial pressure of 3 mmHg.   Comparison(s): No prior Echocardiogram.      History of Present Illness     Per admission H&P:   Alyssa Jacobs is a 81 y.o. female with a hx of CAD who presentrf to the office for follow-up on 04/20/22.   She presented with her daughter.  She reported she is getting short of breath with laying flat.  She also reported low energy and fatigue.  She was not having any chest pain or chest  tightness.  Her echocardiogram was normal.  Her EKG was normal.  Provider discussed her coronary CTA which shows an obstructive RCA lesion.  Provider discussed medications versus invasive intervention.  She reported she would like to proceed with invasive intervention.  Her energy level has been quite low.  Apparently she had COVID-19 infection and this contributed.  Her blood pressure slightly elevated.  She has not started her metoprolol yet.  She  planned to start this.  She was arranged for LHC on 05/05/22.   Post procedure she was lethargic and admitted to hospital overnight for observation.      Hospital Course     Consultants: N/A    CAD - admitted after outpatient Tiro 05/05/22 for observation due to lethargy post-procedure, likely due to sedation meds  - LHC showed 70% followed by 99% proximal/mid RCA treated with PCI/DES x1.  Ramus intermediate small to moderate in size with ostial/proximal 70 to 80% stenosis.  - recommend medical therapy: ASA+ Plavix for 6 months; continue PTA metoprolol '25mg'$  BID; Lipitor 77mg daily (started few weeks ago, would re-evaluate in 4-6 weeks) - no further lethargy  - post cath care discussed in detail at bedside - follow up arranged on 05/14/22 with cardiology   HTN - BP fiarly controlled on metoprolol     Did the patient have an acute coronary syndrome (MI, NSTEMI, STEMI, etc) this admission?:  No                               Did the patient have a percutaneous coronary intervention (stent / angioplasty)?:  Yes.     Cath/PCI Registry Performance & Quality Measures: Aspirin prescribed? - Yes ADP Receptor Inhibitor (Plavix/Clopidogrel, Brilinta/Ticagrelor or Effient/Prasugrel) prescribed (includes medically managed patients)? - Yes High Intensity Statin (Lipitor 40-'80mg'$  or Crestor 20-'40mg'$ ) prescribed? - Yes For EF <40%, was ACEI/ARB prescribed? - Not Applicable (EF >/= 476% For EF <40%, Aldosterone Antagonist (Spironolactone or Eplerenone) prescribed? - Not Applicable (EF >/= 454% Cardiac Rehab Phase II ordered? - Yes       The patient will be scheduled for a TOC follow up appointment in 6 days.  [facility] the office where the patient should be seen for follow up.  _____________  Discharge Vitals Blood pressure (!) 134/55, pulse 60, temperature 99.4 F (37.4 C), temperature source Oral, resp. rate 16, height '5\' 4"'$  (16.503m), weight  80.3 kg, SpO2 98 %.  Filed Weights   05/06/22 0609  Weight: 80.3 kg    Vitals:  Vitals:   05/07/22 0537 05/07/22 0844  BP: (!) 121/55 (!) 134/55  Pulse: 63 60  Resp: 18 16  Temp: 99.4 F (37.4 C)   SpO2: 99% 98%   General Appearance: In no apparent distress, laying in bed HEENT: Normocephalic, atraumatic.  Cardiovascular: Regular rate and rhythm, normal S1-S2,  no murmur Respiratory: Resting breathing unlabored, lungs sounds clear to auscultation bilaterally, no use of accessory muscles. On room air.  No wheezes, rales or rhonchi.   Gastrointestinal: Bowel sounds positive, abdomen soft Extremities: Able to move all extremities in bed without difficulty, no edema Skin: Intact, warm, dry. Right radial site with ecchymosis, no bleeding or hematoma, pulse 2+, able to move all finger freely, denied paresthesia  Neurologic: Alert, oriented to person, place and time. Fluent speech, no facial droop, no cognitive deficit Psychiatric: Normal affect. Mood is appropriate.  Labs & Radiologic Studies    CBC Recent Labs    05/06/22 0756  HGB 12.9  HCT 92.1   Basic Metabolic Panel Recent Labs    05/06/22 0756  NA 140  K 4.3  CL 107  GLUCOSE 123*  BUN 20  CREATININE 0.80   Liver Function Tests No results for input(s): "AST", "ALT", "ALKPHOS", "BILITOT", "PROT", "ALBUMIN" in the last 72 hours. No results for input(s): "LIPASE", "AMYLASE" in the last 72 hours. High Sensitivity Troponin:   No results for input(s): "TROPONINIHS" in the last 720 hours.  BNP Invalid input(s): "POCBNP" D-Dimer No results for input(s): "DDIMER" in the last 72 hours. Hemoglobin A1C No results for input(s): "HGBA1C" in the last 72 hours. Fasting Lipid Panel No results for input(s): "CHOL", "HDL", "LDLCALC", "TRIG", "CHOLHDL", "LDLDIRECT" in the last 72 hours. Thyroid Function Tests No results for input(s): "TSH", "T4TOTAL", "T3FREE", "THYROIDAB" in the last 72 hours.  Invalid input(s):  "FREET3" _____________  CARDIAC CATHETERIZATION  Result Date: 05/06/2022 CONCLUSIONS: 70% followed by 99% proximal to mid RCA treated with 32 x 3.0 Synergy postdilated to 3.25 mmHg and high-pressure reducing stenosis to 0% with maintenance of TIMI grade III flow.  No complications. Left main is widely patent. LAD is widely patent and wraps around the apex.  There is mid to distal segmental 60% narrowing after the third diagonal. Ramus intermedius small to moderate in size and contains ostial to proximal 70 to 80% narrowing. Circumflex gives origin to a large branching obtuse marginal.  Distal circumflex supplies collaterals to the distal RCA. Normal LV function with normal LVEDP.  EF greater than 65%. RECOMMENDATIONS: Aspirin and and clopidogrel dual antiplatelet therapy x6 months then de-escalate to clopidogrel 75 mg/day thereafter. Aggressive risk factor modification Outpatient status   CT CORONARY FRACTIONAL FLOW RESERVE DATA PREP  Result Date: 04/15/2022 EXAM: CT FFR ANALYSIS CLINICAL DATA:  abnormal imaging of heart FINDINGS: FFRct analysis was performed on the original cardiac CT angiogram dataset. Diagrammatic representation of the FFRct analysis is provided in a separate PDF document in PACS. This dictation was created using the PDF document and an interactive 3D model of the results. 3D model is not available in the EMR/PACS. Normal FFR range is >0.80. 1. Left Main:  No significant stenosis. FFR = 0.99 2. Ramus: Ostial stenosis with FFR 0.80 after lesion. 3. LAD: No significant stenosis. Proximal FFR = 0.97, Mid FFR = 0.91, Distal FFR = 0.81 4. LCX: No significant stenosis. Proximal FFR = 0.99, Distal FFR = 0.89 5. RCA: Significant focal stenosis in mid RCA. Proximal FFR = 0.94, Mid FFR = 0.60, Distal FFR = 0.58 IMPRESSION: 1. CT FFR analysis shows significant stenosis in the mid RCA, with FFR 0.60. Borderline stenosis in the ostial ramus, FFR 0.80. Results communicated to Dr. Audie Box. Electronically  Signed   By: Buford Dresser M.D.   On: 04/09/2022 18:00  CT CORONARY FRACTIONAL FLOW RESERVE FLUID ANALYSIS  Result Date: 04/15/2022 EXAM: CT FFR ANALYSIS CLINICAL DATA:  abnormal imaging of heart FINDINGS: FFRct analysis was performed on the original cardiac CT angiogram dataset. Diagrammatic representation of the FFRct analysis is provided in a separate PDF document in PACS. This dictation was created using the PDF document and an interactive 3D model of the results. 3D model is not available in the EMR/PACS. Normal FFR range is >0.80. 1. Left Main:  No significant stenosis. FFR = 0.99 2. Ramus: Ostial stenosis with FFR 0.80 after lesion. 3. LAD: No significant stenosis. Proximal FFR =  0.97, Mid FFR = 0.91, Distal FFR = 0.81 4. LCX: No significant stenosis. Proximal FFR = 0.99, Distal FFR = 0.89 5. RCA: Significant focal stenosis in mid RCA. Proximal FFR = 0.94, Mid FFR = 0.60, Distal FFR = 0.58 IMPRESSION: 1. CT FFR analysis shows significant stenosis in the mid RCA, with FFR 0.60. Borderline stenosis in the ostial ramus, FFR 0.80. Results communicated to Dr. Audie Box. Electronically Signed   By: Buford Dresser M.D.   On: 04/09/2022 18:00  CT CORONARY MORPH W/CTA COR W/SCORE W/CA W/CM &/OR WO/CM  Addendum Date: 04/12/2022   ADDENDUM REPORT: 04/12/2022 11:41 EXAM: OVER-READ INTERPRETATION  CT CHEST The following report is an over-read performed by radiologist Dr. Maudry Diego [facility] on 04/12/2022. This over-read does not include interpretation of cardiac or coronary anatomy or pathology. The coronary CTA interpretation by the cardiologist is attached. COMPARISON:  None. FINDINGS: Vascular: Normal heart size. No pericardial effusion. Normal caliber thoracic aorta. No suspicious filling defects of the central pulmonary arteries. Mediastinum/Nodes: Esophagus is unremarkable. No pathologically enlarged lymph nodes seen in the chest. Lungs/Pleura: Lungs are clear. No pleural  effusion or pneumothorax. Ground-glass nodule of the right upper lobe measuring 9 x 5 mm on series 11, image 19. Upper Abdomen: No acute abnormality. Musculoskeletal: No chest wall mass or suspicious bone lesions identified. IMPRESSION: 1. No acute extracardiac abnormality. 2. Ground-glass nodule of the right upper lobe measuring 7 mm. Initial follow-up with CT at 6 months is recommended to confirm persistence. If persistent, repeat CT is recommended every 2 years until 5 years of stability has been established. This recommendation follows the consensus statement: Guidelines for Management of Incidental Pulmonary Nodules Detected on CT Images: From the Fleischner Society 2017; Radiology 2017; 284:228-243. Electronically Signed   By: Yetta Glassman M.D.   On: 04/12/2022 11:41   Result Date: 04/12/2022 HISTORY: Chest pain, nonspecific EXAM: Cardiac/Coronary CT TECHNIQUE: The patient was scanned on a [employer]. PROTOCOL: A 120 kV prospective scan was triggered in the descending thoracic aorta at 111 HU's. Axial non-contrast 3 mm slices were carried out through the heart. The data set was analyzed on a dedicated work station and scored using the Agatston method. Gantry rotation speed was 250 msecs and collimation was .6 mm. Heart rate was optimized medically and sl NTG was given. The 3D data set was reconstructed in 5% intervals of the 35-75 % of the R-R cycle. Systolic and diastolic phases were analyzed on a dedicated work station using MPR, MIP and VRT modes. The patient received 118m OMNIPAQUE IOHEXOL 350 MG/ML SOLN of contrast. FINDINGS: Coronary calcium score: The patient's coronary artery calcium score is 226, which places the patient in the 67 percentile. Coronary arteries: Normal coronary origins.  Right dominance. Right Coronary Artery: Normal caliber vessel, gives rise to PDA. Proximal vessel with diffuse noncalcified plaque and 25-49% stenosis. Mid vessel with predominantly calcified plaque  and 70-99% stenosis. Left Main Coronary Artery: Normal caliber vessel. No significant plaque or stenosis. Ramus intermedius: Small caliber, with ostial and proximal mixed calcified and noncalcified plaque with 70-99% stenosis. Left Anterior Descending Coronary Artery: Normal caliber vessel. Diffuse mixed calcified and noncalcified plaque throughout the proximal and mid LAD, with maximum stenosis 50-69% proximally and 25-49% mid vessel. Gives rise to 2 small diagonal branches. Left Circumflex Artery: Normal caliber vessel. Mixed calcified and noncalcified plaque in proximal vessel with 1-25% stenosis. Gives rise to large first OM branch. OM1 is prominent vessel, with significant decrease in LCx after  the OM. Distal LCx is small caliber and not well seen. Aorta: Normal size, 30 mm at the mid ascending aorta (level of the PA bifurcation) measured double oblique. Aortic atherosclerosis. No dissection seen in visualized portions of the aorta. Aortic Valve: Trivial calcifications. Trileaflet. Other findings: Normal pulmonary vein drainage into the left atrium. Normal left atrial appendage without a thrombus. Normal size of the pulmonary artery. Normal appearance of the pericardium. IMPRESSION: 1. Severe CAD, CADRADS = 4. CT FFR will be performed and reported separately. Ramus with 70-99% ostial/proximal stenosis, LAD with 50-69% proximal stenosis, RCA with mid 70-99% stenosis. 2. Coronary calcium score of 226. This was 67th percentile for age and sex matched control. 3. Normal coronary origin with right dominance. INTERPRETATION: 1. CAD-RADS 0: No evidence of CAD (0%). Consider non-atherosclerotic causes of chest pain. 2. CAD-RADS 1: Minimal non-obstructive CAD (0-24%). Consider non-atherosclerotic causes of chest pain. Consider preventive therapy and risk factor modification. 3. CAD-RADS 2: Mild non-obstructive CAD (25-49%). Consider non-atherosclerotic causes of chest pain. Consider preventive therapy and risk factor  modification. 4. CAD-RADS 3: Moderate stenosis (50-69%). Consider symptom-guided anti-ischemic pharmacotherapy as well as risk factor modification per guideline directed care. Additional analysis with CT FFR will be submitted. 5. CAD-RADS 4: Severe stenosis. (70-99% or > 50% left main). Cardiac catheterization or CT FFR is recommended. Consider symptom-guided anti-ischemic pharmacotherapy as well as risk factor modification per guideline directed care. Invasive coronary angiography recommended with revascularization per published guideline statements. 6. CAD-RADS 5: Total coronary occlusion (100%). Consider cardiac catheterization or viability assessment. Consider symptom-guided anti-ischemic pharmacotherapy as well as risk factor modification per guideline directed care. 7. CAD-RADS N: Non-diagnostic study. Obstructive CAD can't be excluded. Alternative evaluation is recommended. Electronically Signed: By: Buford Dresser M.D. On: 04/09/2022 17:09    Disposition   Pt is being discharged home today in good condition.  Follow-up Plans & Appointments     Follow-up Information     O'Neal, Cassie Freer, MD Follow up on 05/14/2022.   Specialties: Cardiology, Internal Medicine, Radiology Why: at 9:40am for your follow up appt Contact information: [place] [address] [phone]                Discharge Instructions     Amb Referral to Cardiac Rehabilitation   Complete by: As directed    Diagnosis: Coronary Stents   After initial evaluation and assessments completed: Virtual Based Care may be provided alone or in conjunction with Phase 2 Cardiac Rehab based on patient barriers.: Yes   Diet - low sodium heart healthy   Complete by: As directed    Discharge instructions   Complete by: As directed    PLEASE REMEMBER TO BRING ALL OF YOUR MEDICATIONS TO [place].  PLEASE ATTEND ALL SCHEDULED FOLLOW-UP APPOINTMENTS.   Activity:  Increase activity slowly as tolerated. You may shower, but no soaking baths (or swimming) for 1 week. No driving for 24 hours. No lifting over 5 lbs for 1 week. No sexual activity for 1 week.   You May Return to Work: in 2 weeks (if applicable)  Wound Care: You may wash cath site gently with soap and water. Keep cath site clean and dry. If you notice pain, swelling, bleeding or pus at your cath site, please call [phone].  PLEASE DO NOT MISS ANY DOSES OF YOUR ASPIRIN/PLAVIX !!!!!   Also keep a log of you blood pressures and bring back to your follow up appt. Please call the office  with any questions.   Patients taking blood thinners should generally stay away from medicines like ibuprofen, Advil, Motrin, naproxen, and Aleve due to risk of stomach bleeding. You may take Tylenol as directed or talk to your primary doctor about alternatives.   PLEASE ENSURE THAT YOU DO NOT RUN OUT OF YOUR ASPIRIN/BRILINTA/PLAVIX. This medication is very important to remain on for at least 6 months. IF you have issues obtaining this medication due to cost please CALL the office 3-5 business days prior to running out in order to prevent missing doses of this medication.   Radial Site Care  Refer to this sheet in the next few weeks. These instructions provide you with information on caring for yourself after your procedure. Your caregiver may also give you more specific instructions. Your treatment has been planned according to current medical practices, but problems sometimes occur. Call your caregiver if you have any problems or questions after your procedure.  HOME CARE INSTRUCTIONS You may shower the day after the procedure. Remove the bandage (dressing) and gently wash the site with plain soap and water. Gently pat the site dry.  Do not apply powder or lotion to the site.  Do not submerge the affected site in water for 3 to 5 days.  Inspect the site at least twice daily.  Do not flex or bend the affected  arm for 24 hours.  No lifting over 5 pounds (2.3 kg) for 5 days after your procedure.  Do not drive home if you are discharged the same day of the procedure. Have someone else drive you.  You may drive 24 hours after the procedure unless otherwise instructed by your caregiver.   What to expect: Any bruising will usually fade within 1 to 2 weeks.  Blood that collects in the tissue (hematoma) may be painful to the touch. It should usually decrease in size and tenderness within 1 to 2 weeks.   SEEK IMMEDIATE MEDICAL CARE IF: You have unusual pain at the radial site.  You have redness, warmth, swelling, or pain at the radial site.  You have drainage (other than a small amount of blood on the dressing).  You have chills.  You have a fever or persistent symptoms for more than 72 hours.  You have a fever and your symptoms suddenly get worse.  Your arm becomes pale, cool, tingly, or numb.  You have heavy bleeding from the site. Hold pressure on the site.   Increase activity slowly   Complete by: As directed        Discharge Medications   Allergies as of 05/07/2022       Reactions   Codeine Other (See Comments)   Hallucinations   Sulfa Antibiotics Other (See Comments)   Yeast infection        Medication List     STOP taking these medications    bismuth subsalicylate 109 NA/35TD suspension Commonly known as: PEPTO BISMOL       TAKE these medications    acetaminophen 500 MG tablet Commonly known as: TYLENOL Take 500 mg by mouth every 8 (eight) hours as needed for mild pain.   Aspirin Low Dose 81 MG tablet Generic drug: aspirin EC Take 1 tablet (81 mg total) by mouth daily. Swallow whole.   atorvastatin 40 MG tablet Commonly known as: LIPITOR Take 1 tablet (40 mg total) by mouth daily.   azelastine 0.05 % ophthalmic solution Commonly known as: OPTIVAR Place 1 drop into both eyes daily as needed (allergies).  betamethasone dipropionate 0.05 % cream Apply 1  Application topically daily as needed (rash).   clopidogrel 75 MG tablet Commonly known as: Plavix Take 1 tablet (75 mg total) by mouth daily.   metoprolol tartrate 25 MG tablet Commonly known as: LOPRESSOR Take 1 tablet (25 mg total) by mouth 2 (two) times daily.   multivitamin capsule Take 1 capsule by mouth daily.   nitroGLYCERIN 0.4 MG SL tablet Commonly known as: NITROSTAT Place 1 tablet (0.4 mg total) under the tongue every 5 (five) minutes as needed for chest pain.   Vitamin D (Ergocalciferol) 1.25 MG (50000 UNIT) Caps capsule Commonly known as: DRISDOL Take 50,000 Units by mouth 3 (three) times a week.           Outstanding Labs/Studies    Duration of Discharge Encounter   Greater than 30 minutes including physician time.  Signed, Margie Billet, NP 05/07/2022, 11:05 AM  Patient seen, examined. Available data reviewed. Agree with findings, assessment, and plan as outlined by Margie Billet, NP.  The patient is independently interviewed and examined.  She is alert, oriented, in no distress.  HEENT is normal, lungs are clear bilaterally, heart is regular rate and rhythm no murmur gallop, abdomen is soft and nontender, extremities with right arm ecchymosis but no hematoma, lower extremities have no edema.  The patient is doing well after PCI yesterday.  Her transient confusion was likely medication related to her analgesics at the time of her heart catheterization.  This has completely cleared today.  Her daughter is at the bedside and we reviewed post PCI instructions.  The patient understands and is medically stable for discharge today.  Follow-up as outlined above.  I agree with the medical program as written.  She will remain on dual antiplatelet therapy with aspirin and clopidogrel for 6 months after treatment of severe RCA stenosis in the setting of stable angina with recommendations for medical therapy of her moderate residual CAD.  Sherren Mocha, M.D. 05/07/2022 12:00  PM

## 2022-05-07 NOTE — Progress Notes (Signed)
Discharge instructions provided to patient. All medications, follow up appointments, and discharge instructions provided. IV out. Monitor off CCMD notified. Discharging to home with daughter.  Era Bumpers, RN

## 2022-05-13 NOTE — Progress Notes (Signed)
Cardiology Office Note:   Date:  05/14/2022  NAME:  Alyssa Jacobs    MRN: [id number] DOB:  [birth year]   PCP:  Kathyrn Lass, MD  Cardiologist:  Evalina Field, MD  Electrophysiologist:  None   Referring MD: Kathyrn Lass, MD   Chief Complaint  Patient presents with   Follow-up    History of Present Illness:   Alyssa Jacobs is a 81 y.o. female with a hx of CAD, HLD, HTN who presents for follow-up.  She reports she is doing well since her stent to the mid RCA.  No chest pain.  Shortness of breath is actually improved.  She does have some bruising at the right radial cath site but she has a 2+ pulse.  She has normal sensation in her hand.  She does have residual disease in the ramus that will be managed medically.  She is on Lipitor 40 mg daily.  We discussed the importance of DAPT.  She will continue this for at least 6 months.  We will plan to see her back in a few months and recheck her cholesterol.  Echo shows normal function.    Problem List CAD -99% mid RCA -> PCI 05/06/2022 -mid LAD 60% -[place] 80% -CAC 226 (67th percentile) 2. HLD -T chol 185, HDL 55, LDL 111, TG 106  Past Medical History: Past Medical History:  Diagnosis Date   Arthritis    Frequency of urination    GERD (gastroesophageal reflux disease)    no meds currently   Nocturia    Psoriasis     Past Surgical History: Past Surgical History:  Procedure Laterality Date   CORONARY STENT INTERVENTION N/A 05/06/2022   Procedure: CORONARY STENT INTERVENTION;  Surgeon: Belva Crome, MD;  Location: [facility];  Service: Cardiovascular;  Laterality: N/A;   EYE SURGERY     INTRAVASCULAR ULTRASOUND/IVUS N/A 05/06/2022   Procedure: Intravascular Ultrasound/IVUS;  Surgeon: Belva Crome, MD;  Location: [facility];  Service: Cardiovascular;  Laterality: N/A;   LEFT HEART CATH AND CORONARY ANGIOGRAPHY N/A 05/06/2022   Procedure: LEFT HEART CATH AND CORONARY ANGIOGRAPHY;  Surgeon: Belva Crome, MD;   Location: [facility];  Service: Cardiovascular;  Laterality: N/A;   TOTAL KNEE ARTHROPLASTY  04/17/2012   Procedure: TOTAL KNEE ARTHROPLASTY;  Surgeon: Gearlean Alf, MD;  Location: [facility];  Service: Orthopedics;  Laterality: Right;    Current Medications: Current Meds  Medication Sig   acetaminophen (TYLENOL) 500 MG tablet Take 500 mg by mouth every 8 (eight) hours as needed for mild pain.   aspirin EC 81 MG tablet Take 1 tablet (81 mg total) by mouth daily. Swallow whole.   atorvastatin (LIPITOR) 40 MG tablet Take 1 tablet (40 mg total) by mouth daily.   azelastine (OPTIVAR) 0.05 % ophthalmic solution Place 1 drop into both eyes daily as needed (allergies).   betamethasone dipropionate 0.05 % cream Apply 1 Application topically daily as needed (rash).   metoprolol tartrate (LOPRESSOR) 25 MG tablet Take 1 tablet (25 mg total) by mouth 2 (two) times daily.   Multiple Vitamin (MULTIVITAMIN) capsule Take 1 capsule by mouth daily.   nitroGLYCERIN (NITROSTAT) 0.4 MG SL tablet Place 1 tablet (0.4 mg total) under the tongue every 5 (five) minutes as needed for chest pain.   Vitamin D, Ergocalciferol, (DRISDOL) 1.25 MG (50000 UNIT) CAPS capsule Take 50,000 Units by mouth 3 (three) times a week.   [DISCONTINUED] clopidogrel (PLAVIX) 75 MG  tablet Take 1 tablet (75 mg total) by mouth daily.     Allergies:    Codeine and Sulfa antibiotics   Social History: Social History   Socioeconomic History   Marital status: Divorced    Spouse name: Not on file   Number of children: 2   Years of education: Not on file   Highest education level: Not on file  Occupational History   Occupation: Retired [employer]   Smoking status: Former    Types: Cigarettes    Quit date: 04/10/2000    Years since quitting: 22.1   Smokeless tobacco: Never  Vaping Use   Vaping Use: Never used  Substance and Sexual Activity   Alcohol use: No   Drug use: No   Sexual activity: Not on file  Other  Topics Concern   Not on file  Social History Narrative   Not on file   Social Determinants of Health   Financial Resource Strain: Not on file  Food Insecurity: Not on file  Transportation Needs: Not on file  Physical Activity: Not on file  Stress: Not on file  Social Connections: Not on file     Family History: The patient's family history includes Heart disease in her mother.  ROS:   All other ROS reviewed and negative. Pertinent positives noted in the HPI.     EKGs/Labs/Other Studies Reviewed:   The following studies were personally reviewed by me today:  EKG:  EKG is ordered today.  The ekg ordered today demonstrates normal sinus rhythm heart rate 67, nonspecific ST-T changes, and was personally reviewed by me.   Recent Labs: 04/20/2022: Platelets 280 05/06/2022: BUN 20; Creatinine, Ser 0.80; Hemoglobin 12.9; Potassium 4.3; Sodium 140   Recent Lipid Panel    Component Value Date/Time   CHOL 185 03/18/2022 1203   TRIG 106 03/18/2022 1203   HDL 55 03/18/2022 1203   CHOLHDL 3.4 03/18/2022 1203   LDLCALC 111 (H) 03/18/2022 1203    Physical Exam:   VS:  BP 118/60 (BP Location: Right Arm, Patient Position: Sitting, Cuff Size: Normal)   Pulse 67   Ht '5\' 4"'$  (1.626 m)   Wt 177 lb (80.3 kg)   BMI 30.38 kg/m    Wt Readings from Last 3 Encounters:  05/14/22 177 lb (80.3 kg)  05/06/22 177 lb (80.3 kg)  04/20/22 177 lb (80.3 kg)    General: Well nourished, well developed, in no acute distress Head: Atraumatic, normal size  Eyes: PEERLA, EOMI  Neck: Supple, no JVD Endocrine: No thryomegaly Cardiac: Normal S1, S2; RRR; no murmurs, rubs, or gallops Lungs: Clear to auscultation bilaterally, no wheezing, rhonchi or rales  Abd: Soft, nontender, no hepatomegaly  Ext: No edema, pulses 2+ Musculoskeletal: No deformities, BUE and BLE strength normal and equal Skin: Warm and dry, no rashes   Neuro: Alert and oriented to person, place, time, and situation, CNII-XII grossly  intact, no focal deficits  Psych: Normal mood and affect   ASSESSMENT:   Alyssa Jacobs is a 81 y.o. female who presents for the following: 1. Coronary artery disease involving native coronary artery of native heart without angina pectoris   2. Mixed hyperlipidemia     PLAN:   1. Coronary artery disease involving native coronary artery of native heart without angina pectoris 2. Mixed hyperlipidemia -Doing well after PCI to mid RCA.  Residual disease in the ramus but no symptoms of angina.  Her symptoms of shortness of breath have actually improved.  We will plan to continue DAPT for 6 months.  We discussed she can exercise.  She should pursue a proper diet.  Her blood pressure is well controlled.  She is on a statin.  Plan to recheck her cholesterol in 3 months.  All of her risk factors are close to goal.  She has no restrictions.  Disposition: Return in about 3 months (around 08/14/2022).  Medication Adjustments/Labs and Tests Ordered: Current medicines are reviewed at length with the patient today.  Concerns regarding medicines are outlined above.  Orders Placed This Encounter  Procedures   Lipid panel   EKG 12-Lead   Meds ordered this encounter  Medications   clopidogrel (PLAVIX) 75 MG tablet    Sig: Take 1 tablet (75 mg total) by mouth daily.    Dispense:  90 tablet    Refill:  3    Patient Instructions  Medication Instructions:  The current medical regimen is effective;  continue present plan and medications.  *If you need a refill on your cardiac medications before your next appointment, please call your pharmacy*   Lab Work: LIPID (1 week before follow up in 3 months, no lab appointment needed, come fasting- nothing to eat or drink)   If you have labs (blood work) drawn today and your tests are completely normal, you will receive your results only by: [place] (if you have MyChart) OR A paper copy in the mail If you have any lab test that is abnormal or we  need to change your treatment, we will call you to review the results.   Follow-Up: At [facility], you and your health needs are our priority.  As part of our continuing mission to provide you with exceptional heart care, we have created designated Provider Care Teams.  These Care Teams include your primary Cardiologist (physician) and Advanced Practice Providers (APPs -  Physician Assistants and Nurse Practitioners) who all work together to provide you with the care you need, when you need it.  We recommend signing up for the patient portal called "MyChart".  Sign up information is provided on this After Visit Summary.  MyChart is used to connect with patients for Virtual Visits (Telemedicine).  Patients are able to view lab/test results, encounter notes, upcoming appointments, etc.  Non-urgent messages can be sent to your provider as well.   To learn more about what you can do with MyChart, go to [email or web address].    Your next appointment:   3-4 month(s)  The format for your next appointment:   In Person  Provider:   Evalina Field, MD              Time Spent with Patient: I have spent a total of 25 minutes with patient reviewing hospital notes, telemetry, EKGs, labs and examining the patient as well as establishing an assessment and plan that was discussed with the patient.  > 50% of time was spent in direct patient care.  Signed, Addison Naegeli. Audie Box, MD, [place]  [address], [place], [place] [address] [phone]  05/14/2022 2:16 PM

## 2022-05-14 ENCOUNTER — Encounter: Payer: Self-pay | Admitting: Cardiovascular Disease

## 2022-05-14 ENCOUNTER — Ambulatory Visit: Payer: Medicare Other | Admitting: Cardiovascular Disease

## 2022-05-14 VITALS — BP 118/60 | HR 67 | Ht 64.0 in | Wt 177.0 lb

## 2022-05-14 DIAGNOSIS — E782 Mixed hyperlipidemia: Secondary | ICD-10-CM

## 2022-05-14 DIAGNOSIS — I2510 Atherosclerotic heart disease of native coronary artery without angina pectoris: Secondary | ICD-10-CM

## 2022-05-14 MED ORDER — CLOPIDOGREL BISULFATE 75 MG PO TABS: 75.0000 mg | ORAL_TABLET | Freq: Every day | ORAL | 3 refills | Status: AC

## 2022-05-14 NOTE — Patient Instructions (Signed)
Medication Instructions:  The current medical regimen is effective;  continue present plan and medications.  *If you need a refill on your cardiac medications before your next appointment, please call your pharmacy*   Lab Work: LIPID (1 week before follow up in 3 months, no lab appointment needed, come fasting- nothing to eat or drink)   If you have labs (blood work) drawn today and your tests are completely normal, you will receive your results only by: [place] (if you have MyChart) OR A paper copy in the mail If you have any lab test that is abnormal or we need to change your treatment, we will call you to review the results.   Follow-Up: At [facility], you and your health needs are our priority.  As part of our continuing mission to provide you with exceptional heart care, we have created designated Provider Care Teams.  These Care Teams include your primary Cardiologist (physician) and Advanced Practice Providers (APPs -  Physician Assistants and Nurse Practitioners) who all work together to provide you with the care you need, when you need it.  We recommend signing up for the patient portal called "MyChart".  Sign up information is provided on this After Visit Summary.  MyChart is used to connect with patients for Virtual Visits (Telemedicine).  Patients are able to view lab/test results, encounter notes, upcoming appointments, etc.  Non-urgent messages can be sent to your provider as well.   To learn more about what you can do with MyChart, go to [email or web address].    Your next appointment:   3-4 month(s)  The format for your next appointment:   In Person  Provider:   Evalina Field, MD

## 2022-05-25 ENCOUNTER — Telehealth: Payer: Self-pay

## 2022-05-25 NOTE — Patient Outreach (Signed)
  Care Coordination   05/25/2022 Name: Alyssa Jacobs MRN: [id number] DOB: 10/31/1940   Care Coordination Outreach Attempts:  An unsuccessful telephone outreach was attempted today to offer the patient information about available care coordination services as a benefit of their health plan.   Follow Up Plan:  Additional outreach attempts will be made to offer the patient care coordination information and services.   Encounter Outcome:  No Answer  Care Coordination Interventions Activated:  No   Care Coordination Interventions:  No, not indicated     [place] [facility] [phone]

## 2022-05-27 ENCOUNTER — Telehealth (HOSPITAL_COMMUNITY): Payer: Self-pay | Admitting: *Deleted

## 2022-05-27 NOTE — Telephone Encounter (Signed)
Alyssa Jacobs returned call from message left earlier by support staff with Cardiac rehab.  Spoke with pt regarding Cardiac Rehab.  Pt who exercised regularly prior to this cardiac event has been advised by Dr. Audie Box to add back activity slowly until she has resumed her previous exercise activities at the gym.  Declines to participate in cardiac rehab at this time. [name], BSN Cardiac and Training and development officer

## 2022-06-09 NOTE — Patient Outreach (Signed)
  Care Coordination   Initial Visit Note    Name: FERGIE SHERBERT MRN: [id number] DOB: Sep 17, 1941  JOYCE HEITMAN is a 81 y.o. year old female who sees Kathyrn Lass, MD for primary care. I spoke with  [name] by phone today.  What matters to the patients health and wellness today?  No Concerns Expressed.    Goals Addressed             This Visit's Progress    Care Coordination Activities       Care Coordination Interventions: Reviewed medication and treatment plans. Reports managing well.  Assessed social determinant of health barriers. AWV up to date. Completed on 12/07/21.        SDOH assessments and interventions completed:  Yes  SDOH Interventions Today    Flowsheet Row Most Recent Value  SDOH Interventions   Food Insecurity Interventions Intervention Not Indicated  Transportation Interventions Intervention Not Indicated        Care Coordination Interventions Activated:  Yes  Care Coordination Interventions:  Yes, provided   Follow up plan: No further intervention required.   Encounter Outcome:  Pt. Visit Completed   [place] Management [phone]

## 2022-06-14 DIAGNOSIS — L309 Dermatitis, unspecified: Secondary | ICD-10-CM | POA: Diagnosis not present

## 2022-07-05 DIAGNOSIS — D692 Other nonthrombocytopenic purpura: Secondary | ICD-10-CM | POA: Diagnosis not present

## 2022-07-05 DIAGNOSIS — L400 Psoriasis vulgaris: Secondary | ICD-10-CM | POA: Diagnosis not present

## 2022-07-12 DIAGNOSIS — M150 Primary generalized (osteo)arthritis: Secondary | ICD-10-CM | POA: Diagnosis not present

## 2022-07-12 DIAGNOSIS — L409 Psoriasis, unspecified: Secondary | ICD-10-CM | POA: Diagnosis not present

## 2022-07-12 DIAGNOSIS — R051 Acute cough: Secondary | ICD-10-CM | POA: Diagnosis not present

## 2022-07-27 DIAGNOSIS — Z23 Encounter for immunization: Secondary | ICD-10-CM | POA: Diagnosis not present

## 2022-07-27 DIAGNOSIS — M150 Primary generalized (osteo)arthritis: Secondary | ICD-10-CM | POA: Diagnosis not present

## 2022-07-27 DIAGNOSIS — R5383 Other fatigue: Secondary | ICD-10-CM | POA: Diagnosis not present

## 2022-07-27 DIAGNOSIS — E785 Hyperlipidemia, unspecified: Secondary | ICD-10-CM | POA: Diagnosis not present

## 2022-07-27 DIAGNOSIS — R350 Frequency of micturition: Secondary | ICD-10-CM | POA: Diagnosis not present

## 2022-07-27 DIAGNOSIS — M179 Osteoarthritis of knee, unspecified: Secondary | ICD-10-CM | POA: Diagnosis not present

## 2022-07-27 DIAGNOSIS — I25118 Atherosclerotic heart disease of native coronary artery with other forms of angina pectoris: Secondary | ICD-10-CM | POA: Diagnosis not present

## 2022-07-27 DIAGNOSIS — R7303 Prediabetes: Secondary | ICD-10-CM | POA: Diagnosis not present

## 2022-07-27 DIAGNOSIS — E559 Vitamin D deficiency, unspecified: Secondary | ICD-10-CM | POA: Diagnosis not present

## 2022-08-16 ENCOUNTER — Telehealth: Payer: Self-pay | Admitting: Cardiovascular Disease

## 2022-08-16 NOTE — Telephone Encounter (Signed)
Pt c/o medication issue:  1. Name of Medication: clopidogrel (PLAVIX) 75 MG tablet   2. How are you currently taking this medication (dosage and times per day)? 1 tablet daily  3. Are you having a reaction (difficulty breathing--STAT)? no  4. What is your medication issue? Patient states she has been having a rash for 2 weeks. She says she has been on Plavix for 3 months and is not sure if it is causing the rash. She says it started on her left leg and has spread to her right leg and right arm. She says she saw her PCP and had an appointment with a dermatologist. She says they thought it may be from her arthritis so now she has an appointment with a rheumatologist 12/27. She says she has been putting betamethasone on her rash and it is strong, but it does not help it. She says she thinks she may need to see Dr. Audie Box. She says she is also on other medications. She says she has not had any other problems, but the rash has continuously gotten worse.

## 2022-08-16 NOTE — Telephone Encounter (Signed)
Spoke with pt regarding rash that she has noticed over the last 2 weeks. Pt is not sure if this is related to new medications added back in August after her heart cath and stent placement but she has been told to consider all options for why this rash is showing up. Pt was seen by her PCP and dermatologist that suggested she see a rheumatologist because she has arthritis and this could be dermatitis related to that. Pt has rhematologist appointment on 12/27. Pt states that it began on her left leg then moved up and over to her right leg and now has travelled up to her right arm. Pt states that she doesn't know what to do, she is miserable. Pt has been using betamethasone on rash but it is not helping. Pt would like an appointment with Dr. Audie Box so that he can see the rash. Appointment made. Will forward to Dr. Audie Box to make aware. Pt verbalizes understanding.

## 2022-08-18 ENCOUNTER — Other Ambulatory Visit (HOSPITAL_COMMUNITY): Payer: Self-pay

## 2022-08-24 DIAGNOSIS — N390 Urinary tract infection, site not specified: Secondary | ICD-10-CM | POA: Diagnosis not present

## 2022-08-24 DIAGNOSIS — N8110 Cystocele, unspecified: Secondary | ICD-10-CM | POA: Diagnosis not present

## 2022-08-24 NOTE — Progress Notes (Signed)
Cardiology Office Note:   Date:  08/27/2022  NAME:  Alyssa Jacobs    MRN: [id number] DOB:  [birth year]   PCP:  Alyssa Margarita, DO  Cardiologist:  Evalina Field, MD  Electrophysiologist:  None   Referring MD: Alyssa Margarita, DO   Chief Complaint  Patient presents with   Follow-up         History of Present Illness:   Alyssa Jacobs is a 81 y.o. female with a hx of CAD status post PCI and hyperlipidemia who presents for follow-up.  Has had a rash and request to reevaluate her medications.  She reports she developed a rash which seems to be moving.  It was in the left lower extremity.  She also has been diagnosed with psoriasis.  She was seen by dermatology with plans to see rheumatology.  We discussed that this is not related to Plavix.  She reports she is short of breath.  She gets short of breath in the afternoons.  Also with activity.  She did not participate with cardiac rehab.  I recommended she do this.  Her echo was normal.  She has single-vessel CAD.  Really no cardiac explanation.  On her chest CT from her coronary CTA she did have groundglass in a single lung nodule.  36-monthfollow-up was recommended.  We will proceed with this.  I do wonder if this is rheumatologic in nature.  She will need repeat lipids.  She will see her primary care doctor next month.  She will forward me the results.  Plan for 635-monthf aspirin and Plavix.  No symptoms of angina.   Problem List CAD -99% mid RCA -> PCI 05/06/2022 -mid LAD 60% -Ramus 80% -CAC 226 (67th percentile) 2. HLD -T chol 185, HDL 55, LDL 111, TG 106 3. Psoriatic arthritis   Past Medical History: Past Medical History:  Diagnosis Date   Arthritis    Frequency of urination    GERD (gastroesophageal reflux disease)    no meds currently   Nocturia    Psoriasis     Past Surgical History: Past Surgical History:  Procedure Laterality Date   CORONARY STENT INTERVENTION N/A 05/06/2022   Procedure: CORONARY STENT  INTERVENTION;  Surgeon: SmBelva CromeMD;  [facility];  Service: Cardiovascular;  Laterality: N/A;   EYE SURGERY     INTRAVASCULAR ULTRASOUND/IVUS N/A 05/06/2022   Procedure: Intravascular Ultrasound/IVUS;  Surgeon: SmBelva CromeMD;  [facility];  Service: Cardiovascular;  Laterality: N/A;   LEFT HEART CATH AND CORONARY ANGIOGRAPHY N/A 05/06/2022   Procedure: LEFT HEART CATH AND CORONARY ANGIOGRAPHY;  Surgeon: SmBelva CromeMD;  [facility];  Service: Cardiovascular;  Laterality: N/A;   TOTAL KNEE ARTHROPLASTY  04/17/2012   Procedure: TOTAL KNEE ARTHROPLASTY;  Surgeon: FrGearlean AlfMD;  [facility]: [facility];  Service: Orthopedics;  Laterality: Right;    Current Medications: Current Meds  Medication Sig   acetaminophen (TYLENOL) 500 MG tablet Take 500 mg by mouth every 8 (eight) hours as needed for mild pain.   aspirin EC 81 MG tablet Take 1 tablet (81 mg total) by mouth daily. Swallow whole.   atorvastatin (LIPITOR) 40 MG tablet Take 1 tablet (40 mg total) by mouth daily.   betamethasone dipropionate 0.05 % cream Apply 1 Application topically daily as needed (rash).   cephALEXin (KEFLEX) 250 MG capsule Take 250 mg by mouth 3 (three) times daily.   clopidogrel (PLAVIX)  75 MG tablet Take 1 tablet (75 mg total) by mouth daily.   metoprolol tartrate (LOPRESSOR) 25 MG tablet Take 1 tablet (25 mg total) by mouth 2 (two) times daily.   Multiple Vitamin (MULTIVITAMIN) capsule Take 1 capsule by mouth daily.   omeprazole (PRILOSEC) 20 MG capsule Take 20 mg by mouth every morning.   Vitamin D, Ergocalciferol, (DRISDOL) 1.25 MG (50000 UNIT) CAPS capsule Take 50,000 Units by mouth 3 (three) times a week.     Allergies:    Codeine and Sulfa antibiotics   Social History: Social History   Socioeconomic History   Marital status: Divorced    Spouse name: Not on file   Number of children: 2   Years of education: Not on file   Highest education  level: Not on file  Occupational History   Occupation: Retired [place] Use   Smoking status: Former    Types: Cigarettes    Quit date: 04/10/2000    Years since quitting: 22.3   Smokeless tobacco: Never  Vaping Use   Vaping Use: Never used  Substance and Sexual Activity   Alcohol use: No   Drug use: No   Sexual activity: Not on file  Other Topics Concern   Not on file  Social History Narrative   Not on file   Social Determinants of Health   Financial Resource Strain: Not on file  Food Insecurity: No Food Insecurity (05/25/2022)   Hunger Vital Sign    Worried About Running Out of Food in the Last Year: Never true    [place] in the Last Year: Never true  Transportation Needs: No Transportation Needs (05/25/2022)   PRAPARE - Hydrologist (Medical): No    Lack of Transportation (Non-Medical): No  Physical Activity: Not on file  Stress: Not on file  Social Connections: Not on file     Family History: The patient's family history includes Heart disease in her mother.  ROS:   All other ROS reviewed and negative. Pertinent positives noted in the HPI.     EKGs/Labs/Other Studies Reviewed:   The following studies were personally reviewed by me today:.   Recent Labs: 04/20/2022: Platelets 280 05/06/2022: BUN 20; Creatinine, Ser 0.80; Hemoglobin 12.9; Potassium 4.3; Sodium 140   Recent Lipid Panel    Component Value Date/Time   CHOL 185 03/18/2022 1203   TRIG 106 03/18/2022 1203   HDL 55 03/18/2022 1203   CHOLHDL 3.4 03/18/2022 1203   LDLCALC 111 (H) 03/18/2022 1203    Physical Exam:   VS:  BP 134/60 (BP Location: Left Arm, Patient Position: Sitting, Cuff Size: Normal)   Pulse 66   Ht '5\' 4"'$  (1.626 m)   Wt 175 lb 9.6 oz (79.7 kg)   SpO2 98%   BMI 30.14 kg/m    Wt Readings from Last 3 Encounters:  08/27/22 175 lb 9.6 oz (79.7 kg)  05/14/22 177 lb (80.3 kg)  05/06/22 177 lb (80.3 kg)    General: Well nourished, well  developed, in no acute distress Head: Atraumatic, normal size  Eyes: PEERLA, EOMI  Neck: Supple, no JVD Endocrine: No thryomegaly Cardiac: Normal S1, S2; RRR; no murmurs, rubs, or gallops Lungs: Clear to auscultation bilaterally, no wheezing, rhonchi or rales  Abd: Soft, nontender, no hepatomegaly  Ext: No edema, pulses 2+ Musculoskeletal: No deformities, BUE and BLE strength normal and equal Skin: Rash noted in the lower extremities and upper extremities Neuro: Alert and oriented  to person, place, time, and situation, CNII-XII grossly intact, no focal deficits  Psych: Normal mood and affect   ASSESSMENT:   Alyssa Jacobs is a 81 y.o. female who presents for the following: 1. Coronary artery disease involving native coronary artery of native heart without angina pectoris   2. Mixed hyperlipidemia   3. Lung nodule     PLAN:   1. Coronary artery disease involving native coronary artery of native heart without angina pectoris 2. Mixed hyperlipidemia -PCI to the mid RCA on 05/06/2022.  Continue 6 months of DAPT.  She will stop this on 11/06/2022.  Her rash is not related to any of her medications or cardiac catheterization.  Her dermatologist believes she has psoriatic arthritis.  She will see a rheumatologist to determine if she needs biologic therapy.  She will continue current medications.  She is short of breath.  Symptoms could be related to deconditioning.  She has not been that active.  We will refer her to cardiac rehab.  Her echo was normal.  She has no signs of congestive heart failure.  I think this is related to deconditioning.  3. Lung nodule -Groundglass of the right upper lobe with a nodule mention.  65-monthfollow-up CT was recommended.  Unclear if this is contributing to her shortness of breath.  We will repeat her chest CT to determine what is going on.  She may need referral to pulmonology.     Disposition: Return in about 2 months (around 10/28/2022).  Medication  Adjustments/Labs and Tests Ordered: Current medicines are reviewed at length with the patient today.  Concerns regarding medicines are outlined above.  Orders Placed This Encounter  Procedures   CT Chest Wo Contrast   AMB referral to cardiac rehabilitation   No orders of the defined types were placed in this encounter.   Patient Instructions  Medication Instructions:  The current medical regimen is effective;  continue present plan and medications.  *If you need a refill on your cardiac medications before your next appointment, please call your pharmacy*  Testing/Procedures: Chest CT wo contrast   Follow-Up: At [facility] you and your health needs are our priority.  As part of our continuing mission to provide you with exceptional heart care, we have created designated Provider Care Teams.  These Care Teams include your primary Cardiologist (physician) and Advanced Practice Providers (APPs -  Physician Assistants and Nurse Practitioners) who all work together to provide you with the care you need, when you need it.  We recommend signing up for the patient portal called "MyChart".  Sign up information is provided on this After Visit Summary.  MyChart is used to connect with patients for Virtual Visits (Telemedicine).  Patients are able to view lab/test results, encounter notes, upcoming appointments, etc.  Non-urgent messages can be sent to your provider as well.   To learn more about what you can do with MyChart, go to [email or web address]    Your next appointment:   Keep appointment in February   The format for your next appointment:   In Person  Provider:   WEvalina Field MD        Referral to cardiac rehab- they will contact you for an appointment      Time Spent with Patient: I have spent a total of 25 minutes with patient reviewing hospital notes, telemetry, EKGs, labs and examining the patient as well as establishing an assessment and plan that was  discussed with the patient.  >  50% of time was spent in direct patient care.  Signed, Addison Naegeli. Audie Box, MD, [place]  [address], [place], [place] [address] [phone]  08/27/2022 4:44 PM

## 2022-08-27 ENCOUNTER — Ambulatory Visit: Payer: Medicare Other | Attending: Cardiovascular Disease | Admitting: Cardiovascular Disease

## 2022-08-27 ENCOUNTER — Encounter: Payer: Self-pay | Admitting: Cardiovascular Disease

## 2022-08-27 VITALS — BP 134/60 | HR 66 | Ht 64.0 in | Wt 175.6 lb

## 2022-08-27 DIAGNOSIS — R911 Solitary pulmonary nodule: Secondary | ICD-10-CM | POA: Diagnosis not present

## 2022-08-27 DIAGNOSIS — I2510 Atherosclerotic heart disease of native coronary artery without angina pectoris: Secondary | ICD-10-CM | POA: Diagnosis not present

## 2022-08-27 DIAGNOSIS — E782 Mixed hyperlipidemia: Secondary | ICD-10-CM

## 2022-08-27 NOTE — Patient Instructions (Signed)
Medication Instructions:  The current medical regimen is effective;  continue present plan and medications.  *If you need a refill on your cardiac medications before your next appointment, please call your pharmacy*  Testing/Procedures: Chest CT wo contrast   Follow-Up: At [facility], you and your health needs are our priority.  As part of our continuing mission to provide you with exceptional heart care, we have created designated Provider Care Teams.  These Care Teams include your primary Cardiologist (physician) and Advanced Practice Providers (APPs -  Physician Assistants and Nurse Practitioners) who all work together to provide you with the care you need, when you need it.  We recommend signing up for the patient portal called "MyChart".  Sign up information is provided on this After Visit Summary.  MyChart is used to connect with patients for Virtual Visits (Telemedicine).  Patients are able to view lab/test results, encounter notes, upcoming appointments, etc.  Non-urgent messages can be sent to your provider as well.   To learn more about what you can do with MyChart, go to [email or web address].    Your next appointment:   Keep appointment in February   The format for your next appointment:   In Person  Provider:   Evalina Field, MD        Referral to cardiac rehab- they will contact you for an appointment

## 2022-09-03 DIAGNOSIS — R350 Frequency of micturition: Secondary | ICD-10-CM | POA: Diagnosis not present

## 2022-09-03 DIAGNOSIS — Z87440 Personal history of urinary (tract) infections: Secondary | ICD-10-CM | POA: Diagnosis not present

## 2022-09-15 DIAGNOSIS — M1991 Primary osteoarthritis, unspecified site: Secondary | ICD-10-CM | POA: Diagnosis not present

## 2022-09-15 DIAGNOSIS — R682 Dry mouth, unspecified: Secondary | ICD-10-CM | POA: Diagnosis not present

## 2022-09-15 DIAGNOSIS — L409 Psoriasis, unspecified: Secondary | ICD-10-CM | POA: Diagnosis not present

## 2022-09-15 DIAGNOSIS — M2560 Stiffness of unspecified joint, not elsewhere classified: Secondary | ICD-10-CM | POA: Diagnosis not present

## 2022-09-15 DIAGNOSIS — M2550 Pain in unspecified joint: Secondary | ICD-10-CM | POA: Diagnosis not present

## 2022-09-26 NOTE — Progress Notes (Unsigned)
Cardiology Office Note:   Date:  09/27/2022  NAME:  Alyssa Jacobs    MRN: [id number] DOB:  [birth year]   PCP:  Sueanne Margarita, DO  Cardiologist:  Evalina Field, MD  Electrophysiologist:  None   Referring MD: No ref. provider found   Chief Complaint  Patient presents with   Follow-up         History of Present Illness:   PETRITA Jacobs is a 82 y.o. female with a hx of CAD, psoriatic arthritis, HLD who presents for follow-up.  She reports no chest pain.  She is still having shortness of breath.  No significant lung disease seen on recent CT chest.  She did have a small nodule that needs follow-up.  She will do this early next week.  She also has been diagnosed with psoriasis.  Apparently she also has arthritis.  We discussed that she may need to be on biologic medications which is fine.  She reports no bleeding.  She is trying to exercise but reports chronic pain from her arthritis.  She is worried that her heart may be contributing.  Her LDL cholesterol needs to be rechecked.  We discussed stopping her Plavix 6 months after her stent placement.  Her blood pressure is well-controlled.  We also discussed that rheumatologic conditions can predispose to heart disease.  I encouraged her to follow-up with her rheumatologist.  Problem List CAD -99% mid RCA -> PCI 05/06/2022 -mid LAD 60% -Ramus 80% -CAC 226 (67th percentile) 2. HLD -T chol 185, HDL 55, LDL 111, TG 106 3. Psoriatic arthritis   Past Medical History: Past Medical History:  Diagnosis Date   Arthritis    Frequency of urination    GERD (gastroesophageal reflux disease)    no meds currently   Nocturia    Psoriasis     Past Surgical History: Past Surgical History:  Procedure Laterality Date   CORONARY STENT INTERVENTION N/A 05/06/2022   Procedure: CORONARY STENT INTERVENTION;  Surgeon: Belva Crome, MD;  Location: [place] [facility];  Service: Cardiovascular;  Laterality: N/A;   EYE SURGERY     INTRAVASCULAR  ULTRASOUND/IVUS N/A 05/06/2022   Procedure: Intravascular Ultrasound/IVUS;  Surgeon: Belva Crome, MD;  Location: [place] [facility];  Service: Cardiovascular;  Laterality: N/A;   LEFT HEART CATH AND CORONARY ANGIOGRAPHY N/A 05/06/2022   Procedure: LEFT HEART CATH AND CORONARY ANGIOGRAPHY;  Surgeon: Belva Crome, MD;  Location: [place] [facility];  Service: Cardiovascular;  Laterality: N/A;   TOTAL KNEE ARTHROPLASTY  04/17/2012   Procedure: TOTAL KNEE ARTHROPLASTY;  Surgeon: Gearlean Alf, MD;  Location: [facility];  Service: Orthopedics;  Laterality: Right;    Current Medications: Current Meds  Medication Sig   acetaminophen (TYLENOL) 500 MG tablet Take 500 mg by mouth every 8 (eight) hours as needed for mild pain.   aspirin EC 81 MG tablet Take 1 tablet (81 mg total) by mouth daily. Swallow whole.   atorvastatin (LIPITOR) 40 MG tablet Take 1 tablet (40 mg total) by mouth daily.   azelastine (OPTIVAR) 0.05 % ophthalmic solution Place 1 drop into both eyes daily as needed (allergies).   betamethasone dipropionate 0.05 % cream Apply 1 Application topically daily as needed (rash).   cephALEXin (KEFLEX) 250 MG capsule Take 250 mg by mouth 3 (three) times daily.   clopidogrel (PLAVIX) 75 MG tablet Take 1 tablet (75 mg total) by mouth daily.   metoprolol tartrate (LOPRESSOR) 25 MG tablet Take 1 tablet (  25 mg total) by mouth 2 (two) times daily.   Multiple Vitamin (MULTIVITAMIN) capsule Take 1 capsule by mouth daily.   omeprazole (PRILOSEC) 20 MG capsule Take 20 mg by mouth every morning.   Vitamin D, Ergocalciferol, (DRISDOL) 1.25 MG (50000 UNIT) CAPS capsule Take 50,000 Units by mouth 3 (three) times a week.     Allergies:    Codeine and Sulfa antibiotics   Social History: Social History   Socioeconomic History   Marital status: Divorced    Spouse name: Not on file   Number of children: 2   Years of education: Not on file   Highest education level: Not on file  Occupational History    Occupation: Retired [place] Use   Smoking status: Former    Types: Cigarettes    Quit date: 04/10/2000    Years since quitting: 22.4   Smokeless tobacco: Never  Vaping Use   Vaping Use: Never used  Substance and Sexual Activity   Alcohol use: No   Drug use: No   Sexual activity: Not on file  Other Topics Concern   Not on file  Social History Narrative   Not on file   Social Determinants of Health   Financial Resource Strain: Not on file  Food Insecurity: No Food Insecurity (05/25/2022)   Hunger Vital Sign    Worried About Running Out of Food in the Last Year: Never true    [place] in the Last Year: Never true  Transportation Needs: No Transportation Needs (05/25/2022)   PRAPARE - Hydrologist (Medical): No    Lack of Transportation (Non-Medical): No  Physical Activity: Not on file  Stress: Not on file  Social Connections: Not on file     Family History: The patient's family history includes Heart disease in her mother.  ROS:   All other ROS reviewed and negative. Pertinent positives noted in the HPI.     EKGs/Labs/Other Studies Reviewed:   The following studies were personally reviewed by me today:  Recent Labs: 04/20/2022: Platelets 280 05/06/2022: BUN 20; Creatinine, Ser 0.80; Hemoglobin 12.9; Potassium 4.3; Sodium 140   Recent Lipid Panel    Component Value Date/Time   CHOL 185 03/18/2022 1203   TRIG 106 03/18/2022 1203   HDL 55 03/18/2022 1203   CHOLHDL 3.4 03/18/2022 1203   LDLCALC 111 (H) 03/18/2022 1203    Physical Exam:   VS:  BP 130/70   Pulse 69   Ht '5\' 4"'$  (1.626 m)   Wt 172 lb (78 kg)   SpO2 98%   BMI 29.52 kg/m    Wt Readings from Last 3 Encounters:  09/27/22 172 lb (78 kg)  08/27/22 175 lb 9.6 oz (79.7 kg)  05/14/22 177 lb (80.3 kg)    General: Well nourished, well developed, in no acute distress Head: Atraumatic, normal size  Eyes: PEERLA, EOMI  Neck: Supple, no JVD Endocrine: No  thryomegaly Cardiac: Normal S1, S2; RRR; no murmurs, rubs, or gallops Lungs: Clear to auscultation bilaterally, no wheezing, rhonchi or rales  Abd: Soft, nontender, no hepatomegaly  Ext: Diffuse rash noted in the lower extremities Musculoskeletal: No deformities, BUE and BLE strength normal and equal Skin: Warm and dry, no rashes   Neuro: Alert and oriented to person, place, time, and situation, CNII-XII grossly intact, no focal deficits  Psych: Normal mood and affect   ASSESSMENT:   YAQUELINE GUTTER is a 82 y.o. female who presents for the  following: 1. Coronary artery disease involving native coronary artery of native heart without angina pectoris   2. Mixed hyperlipidemia     PLAN:   1. Coronary artery disease involving native coronary artery of native heart without angina pectoris 2. Mixed hyperlipidemia -Status post PCI to the mid RCA.  Found to have a 99% lesion on coronary CTA.  She underwent PCI on 05/06/2022.  She may continue aspirin and Plavix until November 06, 2022.  After that date she will resume aspirin monotherapy.  She did have diffuse disease in the other vessels.  I think it is okay just to be on aspirin monotherapy.  She is on a statin.  She will need recheck of her lipids.  She reports she will see her primary care physician later this month for lab work.  I have asked her to forward me those values.  Her echocardiogram was normal. -She continues to have yeast rash.  She has been diagnosed with psoriasis.  This is not related to her cardiac medications or cardiac condition.  I have encouraged her to follow-up with her rheumatologist and dermatologist for further management.  This seems to be bothering her a lot.     Disposition: Return in about 6 months (around 03/28/2023).  Medication Adjustments/Labs and Tests Ordered: Current medicines are reviewed at length with the patient today.  Concerns regarding medicines are outlined above.  No orders of the defined types were  placed in this encounter.  No orders of the defined types were placed in this encounter.   Patient Instructions  Medication Instructions:  STOP Plavix on February 17th  Continue Aspirin   *If you need a refill on your cardiac medications before your next appointment, please call your pharmacy*   Follow-Up: At [facility], you and your health needs are our priority.  As part of our continuing mission to provide you with exceptional heart care, we have created designated Provider Care Teams.  These Care Teams include your primary Cardiologist (physician) and Advanced Practice Providers (APPs -  Physician Assistants and Nurse Practitioners) who all work together to provide you with the care you need, when you need it.  We recommend signing up for the patient portal called "MyChart".  Sign up information is provided on this After Visit Summary.  MyChart is used to connect with patients for Virtual Visits (Telemedicine).  Patients are able to view lab/test results, encounter notes, upcoming appointments, etc.  Non-urgent messages can be sent to your provider as well.   To learn more about what you can do with MyChart, go to [email or web address].    Your next appointment:   6 month(s)  The format for your next appointment:   In Person  Provider:   Evalina Field, MD           Time Spent with Patient: I have spent a total of 25 minutes with patient reviewing hospital notes, telemetry, EKGs, labs and examining the patient as well as establishing an assessment and plan that was discussed with the patient.  > 50% of time was spent in direct patient care.  Signed, Addison Naegeli. Audie Box, MD, [place]  [address] [place], [place] [address] [phone]  09/27/2022 6:01 PM

## 2022-09-27 ENCOUNTER — Ambulatory Visit: Payer: Medicare Other | Attending: Cardiovascular Disease | Admitting: Cardiovascular Disease

## 2022-09-27 ENCOUNTER — Telehealth: Payer: Self-pay

## 2022-09-27 ENCOUNTER — Encounter: Payer: Self-pay | Admitting: Cardiovascular Disease

## 2022-09-27 VITALS — BP 130/70 | HR 69 | Ht 64.0 in | Wt 172.0 lb

## 2022-09-27 DIAGNOSIS — I2510 Atherosclerotic heart disease of native coronary artery without angina pectoris: Secondary | ICD-10-CM | POA: Diagnosis not present

## 2022-09-27 DIAGNOSIS — E782 Mixed hyperlipidemia: Secondary | ICD-10-CM

## 2022-09-27 NOTE — Telephone Encounter (Signed)
Patient called back, states she wants to keep her appointment this afternoon. Advised we would see her then.   Patient verbalized understanding, thankful for call back

## 2022-09-27 NOTE — Telephone Encounter (Signed)
Attempted to contact patient to discuss if appointment was still needed.   Advised to call back.   Thank you!

## 2022-09-27 NOTE — Patient Instructions (Signed)
Medication Instructions:  STOP Plavix on February 17th  Continue Aspirin   *If you need a refill on your cardiac medications before your next appointment, please call your pharmacy*   Follow-Up: At [facility], you and your health needs are our priority.  As part of our continuing mission to provide you with exceptional heart care, we have created designated Provider Care Teams.  These Care Teams include your primary Cardiologist (physician) and Advanced Practice Providers (APPs -  Physician Assistants and Nurse Practitioners) who all work together to provide you with the care you need, when you need it.  We recommend signing up for the patient portal called "MyChart".  Sign up information is provided on this After Visit Summary.  MyChart is used to connect with patients for Virtual Visits (Telemedicine).  Patients are able to view lab/test results, encounter notes, upcoming appointments, etc.  Non-urgent messages can be sent to your provider as well.   To learn more about what you can do with MyChart, go to [email or web address].    Your next appointment:   6 month(s)  The format for your next appointment:   In Person  Provider:   Evalina Field, MD

## 2022-10-01 ENCOUNTER — Ambulatory Visit
Admission: RE | Admit: 2022-10-01 | Discharge: 2022-10-01 | Disposition: A | Discharge status: Home / Self Care | Payer: Medicare Other | Source: Ambulatory Visit | Attending: Cardiovascular Disease | Admitting: Cardiovascular Disease

## 2022-10-01 DIAGNOSIS — R918 Other nonspecific abnormal finding of lung field: Secondary | ICD-10-CM | POA: Diagnosis not present

## 2022-10-01 DIAGNOSIS — R911 Solitary pulmonary nodule: Secondary | ICD-10-CM

## 2022-10-01 DIAGNOSIS — J9811 Atelectasis: Secondary | ICD-10-CM | POA: Diagnosis not present

## 2022-10-04 ENCOUNTER — Telehealth (HOSPITAL_COMMUNITY): Payer: Self-pay

## 2022-10-04 NOTE — Telephone Encounter (Signed)
Called patient to see if she was interested in participating in the Cardiac Rehab Program. Patient stated yes. Patient will come in for orientation on 10/07/22'@9'$ :30am and will attend the 1:45pm exercise class.   Sent information through [employer].

## 2022-10-04 NOTE — Telephone Encounter (Signed)
Called pt to confirm appt, no answer. LM on VM.   Colbert Ewing, MS 10/04/2022 4:50 PM

## 2022-10-04 NOTE — Telephone Encounter (Signed)
Pt insurance is active and benefits verified through [facility] Medicare Co-pay 0, DED 0/0 met, out of pocket $3,800/$30 met, co-insurance 0%. no pre-authorization required. Passport, 10/04/2022'@1'$ :36pm, REF# [id number]   How many CR sessions are covered? (72 sessions for ICR)72 Is this a lifetime maximum or an annual maximum? annual Has the member used any of these services to date? no Is there a time limit (weeks/months) on start of program and/or program completion? no

## 2022-10-05 ENCOUNTER — Telehealth (HOSPITAL_COMMUNITY): Payer: Self-pay

## 2022-10-06 ENCOUNTER — Other Ambulatory Visit: Payer: Self-pay

## 2022-10-06 DIAGNOSIS — R911 Solitary pulmonary nodule: Secondary | ICD-10-CM

## 2022-10-07 ENCOUNTER — Encounter (HOSPITAL_COMMUNITY)
Admission: RE | Admit: 2022-10-07 | Discharge: 2022-10-07 | Disposition: A | Discharge status: Home / Self Care | Payer: Medicare Other | Source: Ambulatory Visit | Attending: Cardiovascular Disease | Admitting: Cardiovascular Disease

## 2022-10-07 ENCOUNTER — Telehealth: Payer: Self-pay | Admitting: Cardiovascular Disease

## 2022-10-07 VITALS — BP 118/68 | HR 76 | Ht 65.0 in | Wt 173.9 lb

## 2022-10-07 DIAGNOSIS — Z955 Presence of coronary angioplasty implant and graft: Secondary | ICD-10-CM | POA: Insufficient documentation

## 2022-10-07 NOTE — Progress Notes (Signed)
Cardiac Rehab Medication Review   Does the patient  feel that his/her medications are working for him/her?  YES  Has the patient been experiencing any side effects to the medications prescribed?  NO  Does the patient measure his/her own blood pressure or blood glucose at home?  YES   Does the patient have any problems obtaining medications due to transportation or finances?   NO  Understanding of regimen: Excellent Understanding of indications: Excellent Potential of compliance: Excellent    Comments: Azariya has a great understanding of her medications, what they are for, and how to take them. She checks her BP at home 1x/week. Mayelin states that tylenol helps with her arthritis pain only when it's not too bad, when it gets worse she lays down to take a nap and that alleviates the rest of her pain.    Colbert Ewing, MS 10/07/2022 9:54 AM

## 2022-10-07 NOTE — Progress Notes (Signed)
Alyssa Jacobs is currently undergoing cardiac rehabilitative therapies at the outpatient facility in [place].  As part of her routine visit she had both a pre and exertional rhythm strip run.  Exertional rhythm strip revealed bigeminy PVCs without any runs of NSVT.  Patient was asymptomatic and vital signs obtained by the staff were within normal limits.  Upon review of her previous echocardiogram from 03/29/2022 patient does have documented mild mitral valve prolapse and this likely explains her PVCs.  She is currently on low-dose metoprolol.  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  She last saw her interventional cardiologist on 09/27/2022 and no changes were made to patient's lipid therapy or her monotherapy with aspirin.  I do not see any recent laboratory values in epic regarding her potassium or magnesium levels.  Requested that the nurse send the current rhythm strips to her cardiologist Dr. Audie Box.

## 2022-10-07 NOTE — Progress Notes (Addendum)
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 Patient asymptomatic. Blood pressure 118/68 resting heart rate 76. Underlying ECG tracing Sinus rhythm. Onsite provider Erin Hearing NP shown today's ECG tracings. Ebony Hail said the patient is okay to proceed with exercise. See Allison's note. Will send today's media from today's orientation to Dr Audie Box for review. Patient completed today's 6 minute walk test without difficulty. Dr O'Neal's office notified about today's noted ectopy. Spoke with Betha Loa RN.  Clarise Cruz said that she will show today's ECG tracings to Dr Gardiner Rhyme. Doctor of the Day. Harrell Gave RN BSN

## 2022-10-07 NOTE — Telephone Encounter (Signed)
Sending over EG tracing under the media tab. Nurse would like to speak to the nurse and or dr. Please advise

## 2022-10-07 NOTE — Telephone Encounter (Signed)
Alyssa Jacobs at cardiac rehab informed that Dr. Audie Box stated the patient can continue with rehab; she is on metoprolol. Verdis Frederickson thanked me for the call.

## 2022-10-07 NOTE — Progress Notes (Signed)
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 Swallow whole., Disp: 90 tablet, Rfl: 1   atorvastatin (LIPITOR) 40 MG tablet, Take 1 tablet (40 mg total) by mouth daily., Disp: 90 tablet, Rfl: 3   azelastine (OPTIVAR) 0.05 % ophthalmic solution, Place 1 drop into both eyes daily as needed (allergies)., Disp: , Rfl:    betamethasone dipropionate 0.05 % cream, Apply 1 Application topically daily as needed (rash)., Disp: , Rfl:    cephALEXin (KEFLEX) 250 MG capsule, Take 250 mg by mouth 3 (three) times daily., Disp: , Rfl:    clopidogrel (PLAVIX) 75 MG tablet, Take 1 tablet (75 mg total) by mouth daily., Disp: 90 tablet, Rfl: 3   metoprolol tartrate (LOPRESSOR) 25 MG tablet, Take 1 tablet (25 mg total) by mouth 2 (two) times daily., Disp: 180 tablet, Rfl: 3   Multiple Vitamin (MULTIVITAMIN) capsule, Take 1 capsule by mouth daily., Disp: , Rfl:    nitroGLYCERIN (NITROSTAT) 0.4 MG SL tablet, Place 1 tablet (0.4 mg total) under the tongue every 5 (five) minutes as needed for chest pain. (Patient not taking: Reported on 08/27/2022), Disp: 25 tablet, Rfl: 3   omeprazole (PRILOSEC) 20 MG capsule, Take 20 mg by mouth every  morning., Disp: , Rfl:    Vitamin D, Ergocalciferol, (DRISDOL) 1.25 MG (50000 UNIT) CAPS capsule, Take 50,000 Units by mouth 3 (three) times a week., Disp: , Rfl:   Past Medical History: Past Medical History:  Diagnosis Date   Arthritis    Frequency of urination    GERD (gastroesophageal reflux disease)    no meds currently   Nocturia    Psoriasis     Tobacco Use: Social History   Tobacco Use  Smoking Status Former   Types: Cigarettes   Quit date: 04/10/2000   Years since quitting: 22.5  Smokeless Tobacco Never    Labs: Review Flowsheet       Latest Ref Rng & Units 03/18/2022 05/06/2022  Labs for ITP Cardiac and Pulmonary Rehab  Cholestrol 100 - 199 mg/dL 185  -  LDL (calc) 0 - 99 mg/dL 111  -  HDL-C >39 mg/dL 55  -  Trlycerides 0 - 149 mg/dL 106  -  TCO2 22 - 32 mmol/L - 21     Capillary Blood Glucose: No results found for: "GLUCAP"   Exercise Target Goals: Exercise Program Goal: Individual exercise prescription set using results from initial 6 min walk test and THRR while considering  patient's activity barriers and safety.   Exercise Prescription Goal: Initial exercise prescription builds to 30-45 minutes a day of aerobic activity, 2-3 days per week.  Home exercise guidelines will be given to patient during program as part  of exercise prescription that the participant will acknowledge.  Activity Barriers & Risk Stratification:  Activity Barriers & Cardiac Risk Stratification - 10/07/22 1219       Activity Barriers & Cardiac Risk Stratification   Activity Barriers Arthritis;Right Knee Replacement;Joint Problems;Shortness of Breath;Balance Concerns;History of Falls;Assistive Device;Deconditioning    Cardiac Risk Stratification High   <5 METs 6MWT            6 Minute Walk:  6 Minute Walk     Row Name 10/07/22 1218         6 Minute Walk   Phase Initial     Distance 979 feet     Walk Time 6 minutes     # of Rest Breaks 0     MPH 1.85     METS  1.56     RPE 11     Perceived Dyspnea  1     VO2 Peak 5.55     Symptoms No     Resting HR 76 bpm     Resting BP 118/68     Resting Oxygen Saturation  100 %     Exercise Oxygen Saturation  during 6 min walk 100 %     Max Ex. HR 94 bpm     Max Ex. BP 126/64     2 Minute Post BP 122/70              Oxygen Initial Assessment:   Oxygen Re-Evaluation:   Oxygen Discharge (Final Oxygen Re-Evaluation):   Initial Exercise Prescription:  Initial Exercise Prescription - 10/07/22 1200       Date of Initial Exercise RX and Referring Provider   Date 10/07/22    Referring Provider Eleonore Chiquito, MD    Expected Discharge Date 12/03/22      Recumbant Bike   Level 1    RPM 60    Minutes 15    METs 2      NuStep   Level 1    SPM 75    Minutes 15    METs 1.7      Prescription Details   Frequency (times per week) 3    Duration Progress to 30 minutes of continuous aerobic without signs/symptoms of physical distress      Intensity   THRR 40-80% of Max Heartrate 56-112    Ratings of Perceived Exertion 11-13    Perceived Dyspnea 0-4      Progression   Progression Continue to progress workloads to maintain intensity without signs/symptoms of physical distress.      Resistance Training   Training Prescription Yes    Weight 2    Reps 10-15             Perform Capillary Blood Glucose checks as needed.  Exercise Prescription Changes:   Exercise Comments:   Exercise Goals and Review:   Exercise Goals     Row Name 10/07/22 1222             Exercise Goals   Increase Physical Activity Yes       Intervention Provide advice, education, support and counseling about physical activity/exercise needs.;Develop an individualized exercise prescription for aerobic and resistive training based on initial evaluation findings, risk stratification, comorbidities and participant's personal goals.       Expected Outcomes Short Term: Attend rehab on a regular basis to  increase amount of physical activity.;Long Term: Exercising regularly at least 3-5 days a week.;Long Term: Add in home exercise to make exercise part  of routine and to increase amount of physical activity.       Increase Strength and Stamina Yes       Intervention Provide advice, education, support and counseling about physical activity/exercise needs.;Develop an individualized exercise prescription for aerobic and resistive training based on initial evaluation findings, risk stratification, comorbidities and participant's personal goals.       Expected Outcomes Short Term: Increase workloads from initial exercise prescription for resistance, speed, and METs.;Short Term: Perform resistance training exercises routinely during rehab and add in resistance training at home;Long Term: Improve cardiorespiratory fitness, muscular endurance and strength as measured by increased METs and functional capacity (6MWT)       Able to understand and use rate of perceived exertion (RPE) scale Yes       Intervention Provide education and explanation on how to use RPE scale       Expected Outcomes Short Term: Able to use RPE daily in rehab to express subjective intensity level;Long Term:  Able to use RPE to guide intensity level when exercising independently       Knowledge and understanding of Target Heart Rate Range (THRR) Yes       Intervention Provide education and explanation of THRR including how the numbers were predicted and where they are located for reference       Expected Outcomes Short Term: Able to state/look up THRR;Short Term: Able to use daily as guideline for intensity in rehab;Long Term: Able to use THRR to govern intensity when exercising independently       Understanding of Exercise Prescription Yes       Intervention Provide education, explanation, and written materials on patient's individual exercise prescription       Expected Outcomes Short Term: Able to explain program exercise prescription;Long  Term: Able to explain home exercise prescription to exercise independently                Exercise Goals Re-Evaluation :   Discharge Exercise Prescription (Final Exercise Prescription Changes):   Nutrition:  Target Goals: Understanding of nutrition guidelines, daily intake of sodium '1500mg'$ , cholesterol '200mg'$ , calories 30% from fat and 7% or less from saturated fats, daily to have 5 or more servings of fruits and vegetables.  Biometrics:  Pre Biometrics - 10/07/22 1215       Pre Biometrics   Waist Circumference 36.5 inches    Hip Circumference 44.75 inches    Waist to Hip Ratio 0.82 %    Triceps Skinfold 38 mm    % Body Fat 42.5 %    Grip Strength 22 kg    Flexibility 19.5 in    Single Leg Stand 10.43 seconds              Nutrition Therapy Plan and Nutrition Goals:   Nutrition Assessments:  MEDIFICTS Score Key: ?70 Need to make dietary changes  40-70 Heart Healthy Diet ? 40 Therapeutic Level Cholesterol Diet    Picture Your Plate Scores: <25 Unhealthy dietary pattern with much room for improvement. 41-50 Dietary pattern unlikely to meet recommendations for good health and room for improvement. 51-60 More healthful dietary pattern, with some room for improvement.  >60 Healthy dietary pattern, although there may be some specific behaviors that could be improved.    Nutrition Goals Re-Evaluation:   Nutrition Goals Re-Evaluation:   Nutrition Goals Discharge (Final Nutrition Goals Re-Evaluation):   Psychosocial: Target Goals: Acknowledge presence or absence of significant depression and/or stress, maximize coping skills, provide positive support system. Participant is  able to verbalize types and ability to use techniques and skills needed for reducing stress and depression.  Initial Review & Psychosocial Screening:  Initial Psych Review & Screening - 10/07/22 1016       Initial Review   Current issues with Current Sleep Concerns      Family  Dynamics   Good Support System? Yes   [name] has her son and daughter for support.   Comments Sherolyn recently moved in with her daughter, son in law, and grandchildren for health and safety reasons after a fall 5 years ago. She states that while this has been a big adjustment, she has tried to remain positive and has great communication with her daughter. She also enjoys the added time with her grandchildren who she adores. Yessica has had some diffculty with sleeping at night, and some fatigue during the day which she attritubes to her arthritis pain and says "that's just part of getting old". Eleri is trying to have a positive outlook on the aging process, but says some days it can be tough to accept. [name] denies need for counseling or additional support at this time.      Barriers   Psychosocial barriers to participate in program There are no identifiable barriers or psychosocial needs.;The patient should benefit from training in stress management and relaxation.      Screening Interventions   Interventions Encouraged to exercise;To provide support and resources with identified psychosocial needs;Provide feedback about the scores to participant    Expected Outcomes Short Term goal: Identification and review with participant of any Quality of Life or Depression concerns found by scoring the questionnaire.;Long Term goal: The participant improves quality of Life and PHQ9 Scores as seen by post scores and/or verbalization of changes             Quality of Life Scores:  Quality of Life - 10/07/22 1223       Quality of Life   Select Quality of Life      Quality of Life Scores   Health/Function Pre 17.36 %    Socioeconomic Pre 25.5 %    Psych/Spiritual Pre 23.79 %    Family Pre 11.63 %    GLOBAL Pre 19.89 %            Scores of 19 and below usually indicate a poorer quality of life in these areas.  A difference of  2-3 points is a clinically meaningful difference.  A difference of  2-3 points in the total score of the Quality of Life Index has been associated with significant improvement in overall quality of life, self-image, physical symptoms, and general health in studies assessing change in quality of life.  PHQ-9: Review Flowsheet       10/07/2022  Depression screen PHQ 2/9  Decreased Interest 0  Down, Depressed, Hopeless 1  PHQ - 2 Score 1  Altered sleeping 2  Tired, decreased energy 3  Change in appetite 1  Feeling bad or failure about yourself  0  Trouble concentrating 0  Moving slowly or fidgety/restless 1  Suicidal thoughts 0  PHQ-9 Score 8  Difficult doing work/chores Not difficult at all   Interpretation of Total Score  Total Score Depression Severity:  1-4 = Minimal depression, 5-9 = Mild depression, 10-14 = Moderate depression, 15-19 = Moderately severe depression, 20-27 = Severe depression   Psychosocial Evaluation and Intervention:   Psychosocial Re-Evaluation:   Psychosocial Discharge (Final Psychosocial Re-Evaluation):   Vocational Rehabilitation: Provide vocational rehab assistance  to qualifying candidates.   Vocational Rehab Evaluation & Intervention:  Vocational Rehab - 10/07/22 1226       Initial Vocational Rehab Evaluation & Intervention   Assessment shows need for Vocational Rehabilitation No   Particia is retired and does not need vocational rehab at this time            Education: Education Goals: Education classes will be provided on a weekly basis, covering required topics. Participant will state understanding/return demonstration of topics presented.     Core Videos: Exercise    Move It!  Clinical staff conducted group or individual video education with verbal and written material and guidebook.  Patient learns the recommended Pritikin exercise program. Exercise with the goal of living a long, healthy life. Some of the health benefits of exercise include controlled diabetes, healthier blood pressure levels,  improved cholesterol levels, improved heart and lung capacity, improved sleep, and better body composition. Everyone should speak with their doctor before starting or changing an exercise routine.  Biomechanical Limitations Clinical staff conducted group or individual video education with verbal and written material and guidebook.  Patient learns how biomechanical limitations can impact exercise and how we can mitigate and possibly overcome limitations to have an impactful and balanced exercise routine.  Body Composition Clinical staff conducted group or individual video education with verbal and written material and guidebook.  Patient learns that body composition (ratio of muscle mass to fat mass) is a key component to assessing overall fitness, rather than body weight alone. Increased fat mass, especially visceral belly fat, can put [place] at increased risk for metabolic syndrome, type 2 diabetes, heart disease, and even death. It is recommended to combine diet and exercise (cardiovascular and resistance training) to improve your body composition. Seek guidance from your physician and exercise physiologist before implementing an exercise routine.  Exercise Action Plan Clinical staff conducted group or individual video education with verbal and written material and guidebook.  Patient learns the recommended strategies to achieve and enjoy long-term exercise adherence, including variety, self-motivation, self-efficacy, and positive decision making. Benefits of exercise include fitness, good health, weight management, more energy, better sleep, less stress, and overall well-being.  Medical   Heart Disease Risk Reduction Clinical staff conducted group or individual video education with verbal and written material and guidebook.  Patient learns our heart is our most vital organ as it circulates oxygen, nutrients, white blood cells, and hormones throughout the entire body, and carries waste away. Data  supports a plant-based eating plan like the Pritikin Program for its effectiveness in slowing progression of and reversing heart disease. The video provides a number of recommendations to address heart disease.   Metabolic Syndrome and Belly Fat  Clinical staff conducted group or individual video education with verbal and written material and guidebook.  Patient learns what metabolic syndrome is, how it leads to heart disease, and how one can reverse it and keep it from coming back. You have metabolic syndrome if you have 3 of the following 5 criteria: abdominal obesity, high blood pressure, high triglycerides, low HDL cholesterol, and high blood sugar.  Hypertension and Heart Disease Clinical staff conducted group or individual video education with verbal and written material and guidebook.  Patient learns that high blood pressure, or hypertension, is very common in the [place]. Hypertension is largely due to excessive salt intake, but other important risk factors include being overweight, physical inactivity, drinking too much alcohol, smoking, and not eating enough potassium from fruits and vegetables.  High blood pressure is a leading risk factor for heart attack, stroke, congestive heart failure, dementia, kidney failure, and premature death. Long-term effects of excessive salt intake include stiffening of the arteries and thickening of heart muscle and organ damage. Recommendations include ways to reduce hypertension and the risk of heart disease.  Diseases of Our Time - Focusing on Diabetes Clinical staff conducted group or individual video education with verbal and written material and guidebook.  Patient learns why the best way to stop diseases of our time is prevention, through food and other lifestyle changes. Medicine (such as prescription pills and surgeries) is often only a Band-Aid on the problem, not a long-term solution. Most common diseases of our time include obesity, type 2  diabetes, hypertension, heart disease, and cancer. The Pritikin Program is recommended and has been proven to help reduce, reverse, and/or prevent the damaging effects of metabolic syndrome.  Nutrition   Overview of the Pritikin Eating Plan  Clinical staff conducted group or individual video education with verbal and written material and guidebook.  Patient learns about the [place] for disease risk reduction. The [place] emphasizes a wide variety of unrefined, minimally-processed carbohydrates, like fruits, vegetables, whole grains, and legumes. Go, Caution, and Stop food choices are explained. Plant-based and lean animal proteins are emphasized. Rationale provided for low sodium intake for blood pressure control, low added sugars for blood sugar stabilization, and low added fats and oils for coronary artery disease risk reduction and weight management.  Calorie Density  Clinical staff conducted group or individual video education with verbal and written material and guidebook.  Patient learns about calorie density and how it impacts the Pritikin Eating Plan. Knowing the characteristics of the food you choose will help you decide whether those foods will lead to weight gain or weight loss, and whether you want to consume more or less of them. Weight loss is usually a side effect of the Pritikin Eating Plan because of its focus on low calorie-dense foods.  Label Reading  Clinical staff conducted group or individual video education with verbal and written material and guidebook.  Patient learns about the Pritikin recommended label reading guidelines and corresponding recommendations regarding calorie density, added sugars, sodium content, and whole grains.  Dining Out - Part 1  Clinical staff conducted group or individual video education with verbal and written material and guidebook.  Patient learns that restaurant meals can be sabotaging because they can be so high in  calories, fat, sodium, and/or sugar. Patient learns recommended strategies on how to positively address this and avoid unhealthy pitfalls.  Facts on Fats  Clinical staff conducted group or individual video education with verbal and written material and guidebook.  Patient learns that lifestyle modifications can be just as effective, if not more so, as many medications for lowering your risk of heart disease. A Pritikin lifestyle can help to reduce your risk of inflammation and atherosclerosis (cholesterol build-up, or plaque, in the artery walls). Lifestyle interventions such as dietary choices and physical activity address the cause of atherosclerosis. A review of the types of fats and their impact on blood cholesterol levels, along with dietary recommendations to reduce fat intake is also included.  Nutrition Action Plan  Clinical staff conducted group or individual video education with verbal and written material and guidebook.  Patient learns how to incorporate Pritikin recommendations into their lifestyle. Recommendations include planning and keeping personal health goals in mind as an important part of their success.  Healthy  Mind-Set    Healthy Minds, Bodies, Hearts  Clinical staff conducted group or individual video education with verbal and written material and guidebook.  Patient learns how to identify when they are stressed. Video will discuss the impact of that stress, as well as the many benefits of stress management. Patient will also be introduced to stress management techniques. The way we think, act, and feel has an impact on our hearts.  How Our Thoughts Can Heal Our Hearts  Clinical staff conducted group or individual video education with verbal and written material and guidebook.  Patient learns that negative thoughts can cause depression and anxiety. This can result in negative lifestyle behavior and serious health problems. Cognitive behavioral therapy is an effective method to  help control our thoughts in order to change and improve our emotional outlook.  Additional Videos:  Exercise    Improving Performance  Clinical staff conducted group or individual video education with verbal and written material and guidebook.  Patient learns to use a non-linear approach by alternating intensity levels and lengths of time spent exercising to help burn more calories and lose more body fat. Cardiovascular exercise helps improve heart health, metabolism, hormonal balance, blood sugar control, and recovery from fatigue. Resistance training improves strength, endurance, balance, coordination, reaction time, metabolism, and muscle mass. Flexibility exercise improves circulation, posture, and balance. Seek guidance from your physician and exercise physiologist before implementing an exercise routine and learn your capabilities and proper form for all exercise.  Introduction to Yoga  Clinical staff conducted group or individual video education with verbal and written material and guidebook.  Patient learns about yoga, a discipline of the coming together of mind, breath, and body. The benefits of yoga include improved flexibility, improved range of motion, better posture and core strength, increased lung function, weight loss, and positive self-image. Yoga's heart health benefits include lowered blood pressure, healthier heart rate, decreased cholesterol and triglyceride levels, improved immune function, and reduced stress. Seek guidance from your physician and exercise physiologist before implementing an exercise routine and learn your capabilities and proper form for all exercise.  Medical   Aging: Enhancing Your Quality of Life  Clinical staff conducted group or individual video education with verbal and written material and guidebook.  Patient learns key strategies and recommendations to stay in good physical health and enhance quality of life, such as prevention strategies, having an  advocate, securing a [place], and keeping a list of medications and system for tracking them. It also discusses how to avoid risk for bone loss.  Biology of Weight Control  Clinical staff conducted group or individual video education with verbal and written material and guidebook.  Patient learns that weight gain occurs because we consume more calories than we burn (eating more, moving less). Even if your body weight is normal, you may have higher ratios of fat compared to muscle mass. Too much body fat puts you at increased risk for cardiovascular disease, heart attack, stroke, type 2 diabetes, and obesity-related cancers. In addition to exercise, following the [place] can help reduce your risk.  Decoding Lab Results  Clinical staff conducted group or individual video education with verbal and written material and guidebook.  Patient learns that lab test reflects one measurement whose values change over time and are influenced by many factors, including medication, stress, sleep, exercise, food, hydration, pre-existing medical conditions, and more. It is recommended to use the knowledge from this video to become more involved  with your lab results and evaluate your numbers to speak with your doctor.   Diseases of Our Time - Overview  Clinical staff conducted group or individual video education with verbal and written material and guidebook.  Patient learns that according to the CDC, 50% to 70% of chronic diseases (such as obesity, type 2 diabetes, elevated lipids, hypertension, and heart disease) are avoidable through lifestyle improvements including healthier food choices, listening to satiety cues, and increased physical activity.  Sleep Disorders Clinical staff conducted group or individual video education with verbal and written material and guidebook.  Patient learns how good quality and duration of sleep are important to overall health and  well-being. Patient also learns about sleep disorders and how they impact health along with recommendations to address them, including discussing with a physician.  Nutrition  Dining Out - Part 2 Clinical staff conducted group or individual video education with verbal and written material and guidebook.  Patient learns how to plan ahead and communicate in order to maximize their dining experience in a healthy and nutritious manner. Included are recommended food choices based on the type of restaurant the patient is visiting.   Fueling a Best boy conducted group or individual video education with verbal and written material and guidebook.  There is a strong connection between our food choices and our health. Diseases like obesity and type 2 diabetes are very prevalent and are in large-part due to lifestyle choices. The Pritikin Eating Plan provides plenty of food and hunger-curbing satisfaction. It is easy to follow, affordable, and helps reduce health risks.  Menu Workshop  Clinical staff conducted group or individual video education with verbal and written material and guidebook.  Patient learns that restaurant meals can sabotage health goals because they are often packed with calories, fat, sodium, and sugar. Recommendations include strategies to plan ahead and to communicate with the manager, chef, or server to help order a healthier meal.  Planning Your Eating Strategy  Clinical staff conducted group or individual video education with verbal and written material and guidebook.  Patient learns about the [place] and its benefit of reducing the risk of disease. The [place] does not focus on calories. Instead, it emphasizes high-quality, nutrient-rich foods. By knowing the characteristics of the foods, we choose, we can determine their calorie density and make informed decisions.  Targeting Your Nutrition Priorities  Clinical staff conducted group or  individual video education with verbal and written material and guidebook.  Patient learns that lifestyle habits have a tremendous impact on disease risk and progression. This video provides eating and physical activity recommendations based on your personal health goals, such as reducing LDL cholesterol, losing weight, preventing or controlling type 2 diabetes, and reducing high blood pressure.  Vitamins and Minerals  Clinical staff conducted group or individual video education with verbal and written material and guidebook.  Patient learns different ways to obtain key vitamins and minerals, including through a recommended healthy diet. It is important to discuss all supplements you take with your doctor.   Healthy Mind-Set    Smoking Cessation  Clinical staff conducted group or individual video education with verbal and written material and guidebook.  Patient learns that cigarette smoking and tobacco addiction pose a serious health risk which affects millions of people. Stopping smoking will significantly reduce the risk of heart disease, lung disease, and many forms of cancer. Recommended strategies for quitting are covered, including working with your doctor to develop a successful  plan.  Culinary   Becoming a Pritikin Chef  Clinical staff conducted group or individual video education with verbal and written material and guidebook.  Patient learns that cooking at home can be healthy, cost-effective, quick, and puts them in control. Keys to cooking healthy recipes will include looking at your recipe, assessing your equipment needs, planning ahead, making it simple, choosing cost-effective seasonal ingredients, and limiting the use of added fats, salts, and sugars.  Cooking - Breakfast and Snacks  Clinical staff conducted group or individual video education with verbal and written material and guidebook.  Patient learns how important breakfast is to satiety and nutrition through the entire  day. Recommendations include key foods to eat during breakfast to help stabilize blood sugar levels and to prevent overeating at meals later in the day. Planning ahead is also a key component.  Cooking - Human resources officer conducted group or individual video education with verbal and written material and guidebook.  Patient learns eating strategies to improve overall health, including an approach to cook more at home. Recommendations include thinking of animal protein as a side on your plate rather than center stage and focusing instead on lower calorie dense options like vegetables, fruits, whole grains, and plant-based proteins, such as beans. Making sauces in large quantities to freeze for later and leaving the skin on your vegetables are also recommended to maximize your experience.  Cooking - Healthy Salads and Dressing Clinical staff conducted group or individual video education with verbal and written material and guidebook.  Patient learns that vegetables, fruits, whole grains, and legumes are the foundations of the [place]. Recommendations include how to incorporate each of these in flavorful and healthy salads, and how to create homemade salad dressings. Proper handling of ingredients is also covered. Cooking - Soups and [employer] - Soups and Desserts Clinical staff conducted group or individual video education with verbal and written material and guidebook.  Patient learns that Pritikin soups and desserts make for easy, nutritious, and delicious snacks and meal components that are low in sodium, fat, sugar, and calorie density, while high in vitamins, minerals, and filling fiber. Recommendations include simple and healthy ideas for soups and desserts.   Overview     The Pritikin Solution Program Overview Clinical staff conducted group or individual video education with verbal and written material and guidebook.  Patient learns that the results of the  [place] Program have been documented in more than 100 articles published in peer-reviewed journals, and the benefits include reducing risk factors for (and, in some cases, even reversing) high cholesterol, high blood pressure, type 2 diabetes, obesity, and more! An overview of the three key pillars of the Pritikin Program will be covered: eating well, doing regular exercise, and having a healthy mind-set.  WORKSHOPS  Exercise: Exercise Basics: Building Your Action Plan Clinical staff led group instruction and group discussion with PowerPoint presentation and patient guidebook. To enhance the learning environment the use of posters, models and videos may be added. At the conclusion of this workshop, patients will comprehend the difference between physical activity and exercise, as well as the benefits of incorporating both, into their routine. Patients will understand the FITT (Frequency, Intensity, Time, and Type) principle and how to use it to build an exercise action plan. In addition, safety concerns and other considerations for exercise and cardiac rehab will be addressed by the presenter. The purpose of this lesson is to promote a comprehensive and effective weekly exercise  routine in order to improve patients' overall level of fitness.   Managing Heart Disease: Your Path to a Healthier Heart Clinical staff led group instruction and group discussion with PowerPoint presentation and patient guidebook. To enhance the learning environment the use of posters, models and videos may be added.At the conclusion of this workshop, patients will understand the anatomy and physiology of the heart. Additionally, they will understand how Pritikin's three pillars impact the risk factors, the progression, and the management of heart disease.  The purpose of this lesson is to provide a high-level overview of the heart, heart disease, and how the Pritikin lifestyle positively impacts risk factors.  Exercise  Biomechanics Clinical staff led group instruction and group discussion with PowerPoint presentation and patient guidebook. To enhance the learning environment the use of posters, models and videos may be added. Patients will learn how the structural parts of their bodies function and how these functions impact their daily activities, movement, and exercise. Patients will learn how to promote a neutral spine, learn how to manage pain, and identify ways to improve their physical movement in order to promote healthy living. The purpose of this lesson is to expose patients to common physical limitations that impact physical activity. Participants will learn practical ways to adapt and manage aches and pains, and to minimize their effect on regular exercise. Patients will learn how to maintain good posture while sitting, walking, and lifting.  Balance Training and Fall Prevention  Clinical staff led group instruction and group discussion with PowerPoint presentation and patient guidebook. To enhance the learning environment the use of posters, models and videos may be added. At the conclusion of this workshop, patients will understand the importance of their sensorimotor skills (vision, proprioception, and the vestibular system) in maintaining their ability to balance as they age. Patients will apply a variety of balancing exercises that are appropriate for their current level of function. Patients will understand the common causes for poor balance, possible solutions to these problems, and ways to modify their physical environment in order to minimize their fall risk. The purpose of this lesson is to teach patients about the importance of maintaining balance as they age and ways to minimize their risk of falling.  WORKSHOPS   Nutrition:  Fueling a Scientist, research (physical sciences) led group instruction and group discussion with PowerPoint presentation and patient guidebook. To enhance the learning  environment the use of posters, models and videos may be added. Patients will review the foundational principles of the [place] and understand what constitutes a serving size in each of the food groups. Patients will also learn Pritikin-friendly foods that are better choices when away from home and review make-ahead meal and snack options. Calorie density will be reviewed and applied to three nutrition priorities: weight maintenance, weight loss, and weight gain. The purpose of this lesson is to reinforce (in a group setting) the key concepts around what patients are recommended to eat and how to apply these guidelines when away from home by planning and selecting Pritikin-friendly options. Patients will understand how calorie density may be adjusted for different weight management goals.  Mindful Eating  Clinical staff led group instruction and group discussion with PowerPoint presentation and patient guidebook. To enhance the learning environment the use of posters, models and videos may be added. Patients will briefly review the concepts of the [place] and the importance of low-calorie dense foods. The concept of mindful eating will be introduced as well as the importance  of paying attention to internal hunger signals. Triggers for non-hunger eating and techniques for dealing with triggers will be explored. The purpose of this lesson is to provide patients with the opportunity to review the basic principles of the [place], discuss the value of eating mindfully and how to measure internal cues of hunger and fullness using the Hunger Scale. Patients will also discuss reasons for non-hunger eating and learn strategies to use for controlling emotional eating.  Targeting Your Nutrition Priorities Clinical staff led group instruction and group discussion with PowerPoint presentation and patient guidebook. To enhance the learning environment the use of posters, models and  videos may be added. Patients will learn how to determine their genetic susceptibility to disease by reviewing their family history. Patients will gain insight into the importance of diet as part of an overall healthy lifestyle in mitigating the impact of genetics and other environmental insults. The purpose of this lesson is to provide patients with the opportunity to assess their personal nutrition priorities by looking at their family history, their own health history and current risk factors. Patients will also be able to discuss ways of prioritizing and modifying the [place] for their highest risk areas  Menu  Clinical staff led group instruction and group discussion with PowerPoint presentation and patient guidebook. To enhance the learning environment the use of posters, models and videos may be added. Using menus brought in from [employer], or printed from [employer], patients will apply the [place] dining out guidelines that were presented in the [employer] video. Patients will also be able to practice these guidelines in a variety of provided scenarios. The purpose of this lesson is to provide patients with the opportunity to practice hands-on learning of the [place] with actual menus and practice scenarios.  Label Reading Clinical staff led group instruction and group discussion with PowerPoint presentation and patient guidebook. To enhance the learning environment the use of posters, models and videos may be added. Patients will review and discuss the Pritikin label reading guidelines presented in Pritikin's Label Reading Educational series video. Using fool labels brought in from local grocery stores and markets, patients will apply the label reading guidelines and determine if the packaged food meet the Pritikin guidelines. The purpose of this lesson is to provide patients with the opportunity to review, discuss, and practice  hands-on learning of the Pritikin Label Reading guidelines with actual packaged food labels. [place] Workshops are designed to teach patients ways to prepare quick, simple, and affordable recipes at home. The importance of nutrition's role in chronic disease risk reduction is reflected in its emphasis in the overall Pritikin program. By learning how to prepare essential core Pritikin Eating Plan recipes, patients will increase control over what they eat; be able to customize the flavor of foods without the use of added salt, sugar, or fat; and improve the quality of the food they consume. By learning a set of core recipes which are easily assembled, quickly prepared, and affordable, patients are more likely to prepare more healthy foods at home. These workshops focus on convenient breakfasts, simple entres, side dishes, and desserts which can be prepared with minimal effort and are consistent with nutrition recommendations for cardiovascular risk reduction. Cooking [employer] are taught by a Engineer, materials (RD) who has been trained by the [employer]. The chef or RD has a clear understanding of the importance of minimizing -  if not completely eliminating - added fat, sugar, and sodium in recipes. Throughout the series of [place] Workshop sessions, patients will learn about healthy ingredients and efficient methods of cooking to build confidence in their capability to prepare    Cooking School weekly topics:  Adding Flavor- Sodium-Free  Fast and Healthy Breakfasts  Powerhouse Plant-Based Proteins  Satisfying Salads and Dressings  Simple Sides and Sauces  International Cuisine-Spotlight on the [place] Zones  Delicious Desserts  Savory Soups  Efficiency Cooking - Meals in a Snap  Tasty Appetizers and Snacks  Comforting Weekend Breakfasts  One-Pot Wonders   Fast Evening Meals  Easy [place] (Psychosocial): New Thoughts, New Behaviors Clinical staff led group instruction and group discussion with PowerPoint presentation and patient guidebook. To enhance the learning environment the use of posters, models and videos may be added. Patients will learn and practice techniques for developing effective health and lifestyle goals. Patients will be able to effectively apply the goal setting process learned to develop at least one new personal goal.  The purpose of this lesson is to expose patients to a new skill set of behavior modification techniques such as techniques setting SMART goals, overcoming barriers, and achieving new thoughts and new behaviors.  Managing Moods and Relationships Clinical staff led group instruction and group discussion with PowerPoint presentation and patient guidebook. To enhance the learning environment the use of posters, models and videos may be added. Patients will learn how emotional and chronic stress factors can impact their health and relationships. They will learn healthy ways to manage their moods and utilize positive coping mechanisms. In addition, ICR patients will learn ways to improve communication skills. The purpose of this lesson is to expose patients to ways of understanding how one's mood and health are intimately connected. Developing a healthy outlook can help build positive relationships and connections with others. Patients will understand the importance of utilizing effective communication skills that include actively listening and being heard. They will learn and understand the importance of the "4 Cs" and especially Connections in fostering of a Healthy Mind-Set.  Healthy Sleep for a Healthy Heart Clinical staff led group instruction and group discussion with PowerPoint presentation and patient guidebook. To enhance the learning environment the use of posters, models and videos may be added. At the conclusion  of this workshop, patients will be able to demonstrate knowledge of the importance of sleep to overall health, well-being, and quality of life. They will understand the symptoms of, and treatments for, common sleep disorders. Patients will also be able to identify daytime and nighttime behaviors which impact sleep, and they will be able to apply these tools to help manage sleep-related challenges. The purpose of this lesson is to provide patients with a general overview of sleep and outline the importance of quality sleep. Patients will learn about a few of the most common sleep disorders. Patients will also be introduced to the concept of "sleep hygiene," and discover ways to self-manage certain sleeping problems through simple daily behavior changes. Finally, the workshop will motivate patients by clarifying the links between quality sleep and their goals of heart-healthy living.   Recognizing and Reducing Stress Clinical staff led group instruction and group discussion with PowerPoint presentation and patient guidebook. To enhance the learning environment the use of posters, models and videos may be added. At the conclusion of this workshop, patients will be able to understand the types of stress reactions,  differentiate between acute and chronic stress, and recognize the impact that chronic stress has on their health. They will also be able to apply different coping mechanisms, such as reframing negative self-talk. Patients will have the opportunity to practice a variety of stress management techniques, such as deep abdominal breathing, progressive muscle relaxation, and/or guided imagery.  The purpose of this lesson is to educate patients on the role of stress in their lives and to provide healthy techniques for coping with it.  Learning Barriers/Preferences:  Learning Barriers/Preferences - 10/07/22 1224       Learning Barriers/Preferences   Learning Barriers Sight;Hearing   wears glasses, in process  of getting hearing aids   Learning Preferences Audio;Computer/Internet;Individual Instruction;Group Instruction;Pictoral;Skilled Demonstration;Verbal Instruction;Video;Written Material             Education Topics:  Knowledge Questionnaire Score:  Knowledge Questionnaire Score - 10/07/22 1226       Knowledge Questionnaire Score   Pre Score 19/24             Core Components/Risk Factors/Patient Goals at Admission:  Personal Goals and Risk Factors at Admission - 10/07/22 1226       Core Components/Risk Factors/Patient Goals on Admission   Hypertension Yes    Intervention Provide education on lifestyle modifcations including regular physical activity/exercise, weight management, moderate sodium restriction and increased consumption of fresh fruit, vegetables, and low fat dairy, alcohol moderation, and smoking cessation.;Monitor prescription use compliance.    Expected Outcomes Short Term: Continued assessment and intervention until BP is < 140/75m HG in hypertensive participants. < 130/830mHG in hypertensive participants with diabetes, heart failure or chronic kidney disease.;Long Term: Maintenance of blood pressure at goal levels.    Lipids Yes    Intervention Provide education and support for participant on nutrition & aerobic/resistive exercise along with prescribed medications to achieve LDL '70mg'$ , HDL >'40mg'$ .    Expected Outcomes Short Term: Participant states understanding of desired cholesterol values and is compliant with medications prescribed. Participant is following exercise prescription and nutrition guidelines.;Long Term: Cholesterol controlled with medications as prescribed, with individualized exercise RX and with personalized nutrition plan. Value goals: LDL < '70mg'$ , HDL > 40 mg.             Core Components/Risk Factors/Patient Goals Review:    Core Components/Risk Factors/Patient Goals at Discharge (Final Review):    ITP Comments:  ITP Comments      Row Name 10/07/22 0955           ITP Comments Dr. TrFransico Himedical director. Introduction to pritikin education program/ intensive cardiac rehab. Initial orientation packet reviewed with patient.                Comments: Participant attended orientation for the cardiac rehabilitation program on  10/07/2022  to perform initial intake and exercise walk test. Patient introduced to the [facility] [facility] and orientation packet was reviewed. Completed 6-minute walk test, measurements, initial ITP, and exercise prescription. Vital signs stable. Telemetry-normal sinus rhythm with bigeminal and trigeminal PVC's, asymptomatic. PVC's not previously documented, RN MaVerdis Fredericksonnd onsite provider AlErin HearingNP made aware. See notes for details, EKG strips to be sent to Dr. O'Audie Box   Service time was from 920 to 1158.

## 2022-10-07 NOTE — Telephone Encounter (Signed)
Spoke with Verdis Frederickson from cardiac rehab who sent 3 tracings of oh heart rhythm while patient was exercising. Tracings showed bigeminal PVCs (find under Media tab). VSS. No symptoms. Verdis Frederickson wanted to know if patient can attend rehab on Monday. Showed Dr. Gardiner Rhyme (DOD) rhythms. He said it is okay with him for patient to attend rehab. He ordered for Dr. Audie Box to be made aware as well. Verdis Frederickson advised. Message forwarded to Dr. Audie Box for his review.

## 2022-10-08 ENCOUNTER — Telehealth: Payer: Self-pay

## 2022-10-08 ENCOUNTER — Other Ambulatory Visit: Payer: Self-pay

## 2022-10-08 DIAGNOSIS — I493 Ventricular premature depolarization: Secondary | ICD-10-CM

## 2022-10-08 NOTE — Telephone Encounter (Signed)
Called patient, she was advised per Dr.O'Neal we needed to check BMET, MAG- lab work ordered.   Patient will come next week to have it completed.   Patient verbalized understanding, thankful for call back

## 2022-10-11 ENCOUNTER — Other Ambulatory Visit: Payer: Self-pay

## 2022-10-11 ENCOUNTER — Other Ambulatory Visit (HOSPITAL_COMMUNITY): Payer: Self-pay

## 2022-10-11 DIAGNOSIS — I493 Ventricular premature depolarization: Secondary | ICD-10-CM

## 2022-10-11 DIAGNOSIS — E782 Mixed hyperlipidemia: Secondary | ICD-10-CM

## 2022-10-11 LAB — BASIC METABOLIC PANEL
BUN/Creatinine Ratio: 16 (ref 12–28)
BUN: 14 mg/dL (ref 8–27)
CO2: 25 mmol/L (ref 20–29)
Calcium: 9.5 mg/dL (ref 8.7–10.3)
Chloride: 101 mmol/L (ref 96–106)
Creatinine, Ser: 0.89 mg/dL (ref 0.57–1.00)
Glucose: 101 mg/dL — ABNORMAL HIGH (ref 70–99)
Potassium: 5.6 mmol/L — ABNORMAL HIGH (ref 3.5–5.2)
Sodium: 139 mmol/L (ref 134–144)
eGFR: 65 mL/min/{1.73_m2} (ref >59)

## 2022-10-11 LAB — LIPID PANEL
Chol/HDL Ratio: 2.3 ratio (ref 0.0–4.4)
Cholesterol, Total: 126 mg/dL (ref 100–199)
HDL: 54 mg/dL (ref >39)
LDL Chol Calc (NIH): 56 mg/dL (ref 0–99)
Triglycerides: 85 mg/dL (ref 0–149)
VLDL Cholesterol Cal: 16 mg/dL (ref 5–40)

## 2022-10-11 LAB — MAGNESIUM: Magnesium: 2.2 mg/dL (ref 1.6–2.3)

## 2022-10-13 ENCOUNTER — Encounter (HOSPITAL_COMMUNITY)
Admission: RE | Admit: 2022-10-13 | Discharge: 2022-10-13 | Disposition: A | Discharge status: Home / Self Care | Payer: Medicare Other | Source: Ambulatory Visit | Attending: Cardiovascular Disease | Admitting: Cardiovascular Disease

## 2022-10-13 ENCOUNTER — Other Ambulatory Visit: Payer: Self-pay | Admitting: *Deleted

## 2022-10-13 DIAGNOSIS — Z955 Presence of coronary angioplasty implant and graft: Secondary | ICD-10-CM | POA: Diagnosis not present

## 2022-10-13 DIAGNOSIS — E875 Hyperkalemia: Secondary | ICD-10-CM

## 2022-10-13 NOTE — Progress Notes (Signed)
Daily Session Note  Patient Details  Name: Alyssa Jacobs MRN: [id number] Date of Birth: 1941/07/24 Referring Provider:   Flowsheet Row INTENSIVE CARDIAC REHAB ORIENT from 10/07/2022 in [facility] for [facility], V[facility], & [place]  Referring Provider Eleonore Chiquito, MD       Encounter Date: 10/13/2022  Check In:  Session Check In - 10/13/22 1509       Check-In   Supervising physician immediately available to respond to emergencies [facility] MD immediately available    Physician(s) Diona Browner, NP    Location [facility]    Staff Present Barnet Pall, RN, BSN;Jetta Walker BS, ACSM-CEP, Exercise Physiologist;Olinty Celesta Aver, MS, ACSM-CEP, Exercise Physiologist;David Lilyan Punt, MS, ACSM-CEP, CCRP, Exercise Physiologist;Johnny Starleen Blue, MS, Exercise Physiologist    Virtual Visit No    Medication changes reported     No    Fall or balance concerns reported    No    Tobacco Cessation No Change    Warm-up and Cool-down Performed as group-led instruction    Resistance Training Performed No    VAD Patient? No    PAD/SET Patient? No      Pain Assessment   Currently in Pain? No/denies    Pain Score 0-No pain    Multiple Pain Sites No             Capillary Blood Glucose: No results found for this or any previous visit (from the past 24 hour(s)).   Exercise Prescription Changes - 10/13/22 1622       Response to Exercise   Blood Pressure (Admit) 122/72    Blood Pressure (Exercise) 132/70    Blood Pressure (Exit) 91/57   H2O given, recheck 113/72   Heart Rate (Admit) 71 bpm    Heart Rate (Exercise) 85 bpm    Heart Rate (Exit) 74 bpm    Rating of Perceived Exertion (Exercise) 11    Perceived Dyspnea (Exercise) 0    Symptoms 0    Comments Pt first day in the pritikin program    Duration Progress to 30 minutes of  aerobic without signs/symptoms of physical distress    Intensity THRR unchanged      Progression   Progression Continue to  progress workloads to maintain intensity without signs/symptoms of physical distress.    Average METs 1.85      Resistance Training   Training Prescription Yes    Weight 2    Reps 10-15    Time 10 Minutes      Recumbant Bike   Level 1    Minutes 15    METs 1.7      NuStep   Level 1    SPM 69    Minutes 15    METs 2             Social History   Tobacco Use  Smoking Status Former   Types: Cigarettes   Quit date: 04/10/2000   Years since quitting: 22.5  Smokeless Tobacco Never    Goals Met:  Exercise tolerated well No report of concerns or symptoms today  Goals Unmet:  Not Applicable  Comments: Pt started cardiac rehab today.  Pt tolerated light exercise without difficulty. VSS, telemetry-Sinus rhythm with intermittent bigeminal PVC's, asymptomatic.  Medication list reconciled. Pt denies barriers to medicaiton compliance.  PSYCHOSOCIAL ASSESSMENT:  PHQ-8. Pt exhibits positive coping skills, hopeful outlook with supportive family. No psychosocial needs identified at this time, no psychosocial interventions necessary.    Pt enjoys  bible study, reading, journaling and word search.   Pt oriented to exercise equipment and routine.    Understanding verbalized.    Dr. Fransico Him is Medical Director for Cardiac Rehab at [facility].

## 2022-10-14 ENCOUNTER — Other Ambulatory Visit: Payer: Self-pay

## 2022-10-14 DIAGNOSIS — E875 Hyperkalemia: Secondary | ICD-10-CM

## 2022-10-15 ENCOUNTER — Encounter (HOSPITAL_COMMUNITY): Payer: Medicare Other

## 2022-10-15 DIAGNOSIS — M1991 Primary osteoarthritis, unspecified site: Secondary | ICD-10-CM | POA: Insufficient documentation

## 2022-10-15 DIAGNOSIS — R682 Dry mouth, unspecified: Secondary | ICD-10-CM | POA: Insufficient documentation

## 2022-10-15 DIAGNOSIS — Z4689 Encounter for fitting and adjustment of other specified devices: Secondary | ICD-10-CM | POA: Diagnosis not present

## 2022-10-15 LAB — BASIC METABOLIC PANEL
BUN/Creatinine Ratio: 18 (ref 12–28)
BUN: 17 mg/dL (ref 8–27)
CO2: 22 mmol/L (ref 20–29)
Calcium: 9.6 mg/dL (ref 8.7–10.3)
Chloride: 101 mmol/L (ref 96–106)
Creatinine, Ser: 0.94 mg/dL (ref 0.57–1.00)
Glucose: 116 mg/dL — ABNORMAL HIGH (ref 70–99)
Potassium: 5.4 mmol/L — ABNORMAL HIGH (ref 3.5–5.2)
Sodium: 136 mmol/L (ref 134–144)
eGFR: 61 mL/min/{1.73_m2} (ref >59)

## 2022-10-18 ENCOUNTER — Encounter (HOSPITAL_COMMUNITY)
Admission: RE | Admit: 2022-10-18 | Discharge: 2022-10-18 | Disposition: A | Discharge status: Home / Self Care | Payer: Medicare Other | Source: Ambulatory Visit | Attending: Cardiovascular Disease | Admitting: Cardiovascular Disease

## 2022-10-18 DIAGNOSIS — Z955 Presence of coronary angioplasty implant and graft: Secondary | ICD-10-CM | POA: Diagnosis not present

## 2022-10-18 NOTE — Progress Notes (Signed)
QUALITY OF LIFE SCORE REVIEW  Pt completed Quality of Life survey as a participant in Cardiac Rehab.  Scores 21.0 or below are considered low.  Pt score very low in several areas Overall 19.89, Health and Function 17.36, socioeconomic 25.50, physiological and spiritual 23.79, family 11.63. Alyssa Jacobs denies being depressed currently.Patient quality of life slightly altered by physical constraints which limits ability to perform as prior to recent cardiac illness. Alyssa Jacobs says that arthritis limits what she is able to do on a daily basis. Alyssa Jacobs lives with her daughter, son and law and grandchildren. Alyssa Jacobs has good family support Offered emotional support and reassurance. Alyssa Jacobs worked until she was 37. 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

## 2022-10-20 ENCOUNTER — Encounter (HOSPITAL_COMMUNITY)
Admission: RE | Admit: 2022-10-20 | Discharge: 2022-10-20 | Disposition: A | Discharge status: Home / Self Care | Payer: Medicare Other | Source: Ambulatory Visit | Attending: Cardiovascular Disease | Admitting: Cardiovascular Disease

## 2022-10-20 DIAGNOSIS — Z955 Presence of coronary angioplasty implant and graft: Secondary | ICD-10-CM

## 2022-10-20 NOTE — Progress Notes (Signed)
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 Swallow whole., Disp: 90 tablet, Rfl: 1   atorvastatin (LIPITOR) 40 MG tablet, Take 1 tablet (40 mg total) by mouth daily., Disp: 90 tablet, Rfl: 3   azelastine (OPTIVAR) 0.05 % ophthalmic solution, Place 1 drop into both eyes daily as needed (allergies)., Disp: , Rfl:    betamethasone dipropionate 0.05 % cream, Apply 1 Application topically daily as needed (rash)., Disp: , Rfl:    cephALEXin (KEFLEX) 250 MG capsule, Take 250 mg by mouth 3 (three) times daily., Disp: , Rfl:    clopidogrel (PLAVIX) 75 MG tablet, Take 1 tablet (75 mg total) by mouth daily., Disp: 90 tablet, Rfl: 3   metoprolol tartrate (LOPRESSOR) 25 MG tablet, Take 1 tablet (25 mg total) by mouth 2 (two) times daily., Disp: 180 tablet, Rfl: 3   Multiple Vitamin (MULTIVITAMIN) capsule, Take 1 capsule by mouth daily., Disp: , Rfl:    nitroGLYCERIN (NITROSTAT) 0.4 MG SL tablet, Place 1 tablet (0.4 mg total) under the tongue every 5 (five) minutes as needed for chest pain. (Patient not taking: Reported on 08/27/2022), Disp: 25 tablet, Rfl: 3   omeprazole (PRILOSEC) 20 MG capsule, Take 20 mg by mouth every morning., Disp: , Rfl:     Vitamin D, Ergocalciferol, (DRISDOL) 1.25 MG (50000 UNIT) CAPS capsule, Take 50,000 Units by mouth 3 (three) times a week., Disp: , Rfl:   Past Medical History: Past Medical History:  Diagnosis Date   Arthritis    Frequency of urination    GERD (gastroesophageal reflux disease)    no meds currently   Nocturia    Psoriasis     Tobacco Use: Social History   Tobacco Use  Smoking Status Former   Types: Cigarettes   Quit date: 04/10/2000   Years since quitting: 22.5  Smokeless Tobacco Never    Labs: Review Flowsheet       Latest Ref Rng & Units 03/18/2022 05/06/2022 10/11/2022  Labs for ITP Cardiac and Pulmonary Rehab  Cholestrol 100 - 199 mg/dL 185  - 126   LDL (calc) 0 - 99 mg/dL 111  - 56   HDL-C >39 mg/dL 55  - 54   Trlycerides 0 - 149 mg/dL 106  - 85   TCO2 22 - 32 mmol/L - 21  -    Capillary Blood Glucose: No results found for: "GLUCAP"   Exercise Target Goals: Exercise Program Goal: Individual exercise prescription set using results from initial 6 min walk test and THRR while considering  patient's activity barriers and safety.   Exercise Prescription Goal: Initial exercise prescription builds to 30-45 minutes a day of aerobic activity, 2-3 days per week.  Home exercise guidelines  will be given to patient during program as part of exercise prescription that the participant will acknowledge.  Activity Barriers & Risk Stratification:  Activity Barriers & Cardiac Risk Stratification - 10/07/22 1219       Activity Barriers & Cardiac Risk Stratification   Activity Barriers Arthritis;Right Knee Replacement;Joint Problems;Shortness of Breath;Balance Concerns;History of Falls;Assistive Device;Deconditioning    Cardiac Risk Stratification High   <5 METs 6MWT            6 Minute Walk:  6 Minute Walk     Row Name 10/07/22 1218         6 Minute Walk   Phase Initial     Distance 979 feet     Walk Time 6 minutes     # of Rest Breaks 0     MPH 1.85      METS 1.56     RPE 11     Perceived Dyspnea  1     VO2 Peak 5.55     Symptoms No     Resting HR 76 bpm     Resting BP 118/68     Resting Oxygen Saturation  100 %     Exercise Oxygen Saturation  during 6 min walk 100 %     Max Ex. HR 94 bpm     Max Ex. BP 126/64     2 Minute Post BP 122/70              Oxygen Initial Assessment:   Oxygen Re-Evaluation:   Oxygen Discharge (Final Oxygen Re-Evaluation):   Initial Exercise Prescription:  Initial Exercise Prescription - 10/07/22 1200       Date of Initial Exercise RX and Referring Provider   Date 10/07/22    Referring Provider Eleonore Chiquito, MD    Expected Discharge Date 12/03/22      Recumbant Bike   Level 1    RPM 60    Minutes 15    METs 2      NuStep   Level 1    SPM 75    Minutes 15    METs 1.7      Prescription Details   Frequency (times per week) 3    Duration Progress to 30 minutes of continuous aerobic without signs/symptoms of physical distress      Intensity   THRR 40-80% of Max Heartrate 56-112    Ratings of Perceived Exertion 11-13    Perceived Dyspnea 0-4      Progression   Progression Continue to progress workloads to maintain intensity without signs/symptoms of physical distress.      Resistance Training   Training Prescription Yes    Weight 2    Reps 10-15             Perform Capillary Blood Glucose checks as needed.  Exercise Prescription Changes:   Exercise Prescription Changes     Row Name 10/13/22 1622             Response to Exercise   Blood Pressure (Admit) 122/72       Blood Pressure (Exercise) 132/70       Blood Pressure (Exit) 91/57  H2O given, recheck 113/72       Heart Rate (Admit) 71 bpm       Heart Rate (Exercise) 85 bpm       Heart Rate (Exit) 74 bpm       Rating of Perceived Exertion (Exercise) 11       Perceived Dyspnea (  Exercise) 0       Symptoms 0       Comments Pt first day in the pritikin program       Duration Progress to 30 minutes of   aerobic without signs/symptoms of physical distress       Intensity THRR unchanged         Progression   Progression Continue to progress workloads to maintain intensity without signs/symptoms of physical distress.       Average METs 1.85         Resistance Training   Training Prescription Yes       Weight 2       Reps 10-15       Time 10 Minutes         Recumbant Bike   Level 1       Minutes 15       METs 1.7         NuStep   Level 1       SPM 69       Minutes 15       METs 2                Exercise Comments:   Exercise Comments     Row Name 10/13/22 1630           Exercise Comments Pt first day in the [place] program. Pt tolerated exercise well with an average MET level of 1.85. Pt is learning her THRR, RPE and ExRx. But pt felt good about her first day.  Will continue to monitor pt and progress workloads as tolerated without sign or symptom                Exercise Goals and Review:   Exercise Goals     Row Name 10/07/22 1222             Exercise Goals   Increase Physical Activity Yes       Intervention Provide advice, education, support and counseling about physical activity/exercise needs.;Develop an individualized exercise prescription for aerobic and resistive training based on initial evaluation findings, risk stratification, comorbidities and participant's personal goals.       Expected Outcomes Short Term: Attend rehab on a regular basis to increase amount of physical activity.;Long Term: Exercising regularly at least 3-5 days a week.;Long Term: Add in home exercise to make exercise part of routine and to increase amount of physical activity.       Increase Strength and Stamina Yes       Intervention Provide advice, education, support and counseling about physical activity/exercise needs.;Develop an individualized exercise prescription for aerobic and resistive training based on initial evaluation findings, risk stratification, comorbidities  and participant's personal goals.       Expected Outcomes Short Term: Increase workloads from initial exercise prescription for resistance, speed, and METs.;Short Term: Perform resistance training exercises routinely during rehab and add in resistance training at home;Long Term: Improve cardiorespiratory fitness, muscular endurance and strength as measured by increased METs and functional capacity (6MWT)       Able to understand and use rate of perceived exertion (RPE) scale Yes       Intervention Provide education and explanation on how to use RPE scale       Expected Outcomes Short Term: Able to use RPE daily in rehab to express subjective intensity level;Long Term:  Able to use RPE to guide intensity level when exercising independently  Knowledge and understanding of Target Heart Rate Range (THRR) Yes       Intervention Provide education and explanation of THRR including how the numbers were predicted and where they are located for reference       Expected Outcomes Short Term: Able to state/look up THRR;Short Term: Able to use daily as guideline for intensity in rehab;Long Term: Able to use THRR to govern intensity when exercising independently       Understanding of Exercise Prescription Yes       Intervention Provide education, explanation, and written materials on patient's individual exercise prescription       Expected Outcomes Short Term: Able to explain program exercise prescription;Long Term: Able to explain home exercise prescription to exercise independently                Exercise Goals Re-Evaluation :  Exercise Goals Re-Evaluation     Row Name 10/13/22 1625             Exercise Goal Re-Evaluation   Exercise Goals Review Increase Physical Activity;Understanding of Exercise Prescription;Increase Strength and Stamina;Knowledge and understanding of Target Heart Rate Range (THRR);Able to understand and use rate of perceived exertion (RPE) scale       Comments Pt first day  in the [facility] program. Pt tolerated exercise well with an average MET level of 1.85. Pt is learning her THRR, RPE and ExRx. But pt felt good about her first day.       Expected Outcomes Will continue to monitor pt and progress workloads as tolerated without sign or symptom                Discharge Exercise Prescription (Final Exercise Prescription Changes):  Exercise Prescription Changes - 10/13/22 1622       Response to Exercise   Blood Pressure (Admit) 122/72    Blood Pressure (Exercise) 132/70    Blood Pressure (Exit) 91/57   H2O given, recheck 113/72   Heart Rate (Admit) 71 bpm    Heart Rate (Exercise) 85 bpm    Heart Rate (Exit) 74 bpm    Rating of Perceived Exertion (Exercise) 11    Perceived Dyspnea (Exercise) 0    Symptoms 0    Comments Pt first day in the pritikin program    Duration Progress to 30 minutes of  aerobic without signs/symptoms of physical distress    Intensity THRR unchanged      Progression   Progression Continue to progress workloads to maintain intensity without signs/symptoms of physical distress.    Average METs 1.85      Resistance Training   Training Prescription Yes    Weight 2    Reps 10-15    Time 10 Minutes      Recumbant Bike   Level 1    Minutes 15    METs 1.7      NuStep   Level 1    SPM 69    Minutes 15    METs 2             Nutrition:  Target Goals: Understanding of nutrition guidelines, daily intake of sodium '1500mg'$ , cholesterol '200mg'$ , calories 30% from fat and 7% or less from saturated fats, daily to have 5 or more servings of fruits and vegetables.  Biometrics:  Pre Biometrics - 10/07/22 1215       Pre Biometrics   Waist Circumference 36.5 inches    Hip Circumference 44.75 inches    Waist to Hip Ratio 0.82 %  Triceps Skinfold 38 mm    % Body Fat 42.5 %    Grip Strength 22 kg    Flexibility 19.5 in    Single Leg Stand 10.43 seconds              Nutrition Therapy Plan and Nutrition Goals:   Nutrition Therapy & Goals - 10/14/22 0905       Nutrition Therapy   Diet Heart healthy diet    Drug/Food Interactions Statins/Certain Fruits      Personal Nutrition Goals   Nutrition Goal Patient to identify strategies for managing cardiovascular risk by attending the [facility] education and nutrition series weekly    Personal Goal #2 Patient to improve diet quality by using the plate method as a daily guide for meal planning to include lean protien/plant protein, fruits, vegetables, whole grains, and nonfat/lowfat dairy as part of balanced diet.    Comments Toryn will benefit from participation in intensive cardiac rehab for nutrition, exercise, and lifestyle modification.      Intervention Plan   Intervention Prescribe, educate and counsel regarding individualized specific dietary modifications aiming towards targeted core components such as weight, hypertension, lipid management, diabetes, heart failure and other comorbidities.;Nutrition handout(s) given to patient.    Expected Outcomes Short Term Goal: Understand basic principles of dietary content, such as calories, fat, sodium, cholesterol and nutrients.;Long Term Goal: Adherence to prescribed nutrition plan.             Nutrition Assessments:  Nutrition Assessments - 10/14/22 1146       Rate Your Plate Scores   Pre Score 61            MEDIFICTS Score Key: ?70 Need to make dietary changes  40-70 Heart Healthy Diet ? 40 Therapeutic Level Cholesterol Diet   Flowsheet Row INTENSIVE CARDIAC REHAB from 10[facility]13/2022 in [facility], [facility] & [facility]  Picture Your Plate Total Score on Admission 61      Picture Your Plate Scores: <49 Unhealthy dietary pattern with much room for improvement. 41-50 Dietary pattern unlikely to meet recommendations for good health and room for improvement. 51-60 More healthful dietary pattern, with some room for improvement.  >60 Healthy dietary pattern,  although there may be some specific behaviors that could be improved.    Nutrition Goals Re-Evaluation:  Nutrition Goals Re-Evaluation     [place] Name 10/14/22 0905             Goals   Current Weight 173 lb 8 oz (78.7 kg)       Comment lipids WNL, potassium elevated 5.6- was rechecked yesterday and awaiting results/intervention. She does report chronic pain from arthiritis.       Expected Outcome Darleth will benefit from participation in intensive cardiac rehab for nutrition, exercise, and lifestyle modification.                Nutrition Goals Re-Evaluation:  Nutrition Goals Re-Evaluation     [place] Name 10/14/22 0905             Goals   Current Weight 173 lb 8 oz (78.7 kg)       Comment lipids WNL, potassium elevated 5.6- was rechecked yesterday and awaiting results/intervention. She does report chronic pain from arthiritis.       Expected Outcome Derra will benefit from participation in intensive cardiac rehab for nutrition, exercise, and lifestyle modification.                Nutrition  Goals Discharge (Final Nutrition Goals Re-Evaluation):  Nutrition Goals Re-Evaluation - 10/14/22 0905       Goals   Current Weight 173 lb 8 oz (78.7 kg)    Comment lipids WNL, potassium elevated 5.6- was rechecked yesterday and awaiting results/intervention. She does report chronic pain from arthiritis.    Expected Outcome Ajayla will benefit from participation in intensive cardiac rehab for nutrition, exercise, and lifestyle modification.             Psychosocial: Target Goals: Acknowledge presence or absence of significant depression and/or stress, maximize coping skills, provide positive support system. Participant is able to verbalize types and ability to use techniques and skills needed for reducing stress and depression.  Initial Review & Psychosocial Screening:  Initial Psych Review & Screening - 10/07/22 1016       Initial Review   Current issues with Current  Sleep Concerns      Family Dynamics   Good Support System? Yes   [name] has her son and daughter for support.   Comments Leoma recently moved in with her daughter, son in law, and grandchildren for health and safety reasons after a fall 5 years ago. She states that while this has been a big adjustment, she has tried to remain positive and has great communication with her daughter. She also enjoys the added time with her grandchildren who she adores. Tashika has had some diffculty with sleeping at night, and some fatigue during the day which she attritubes to her arthritis pain and says "that's just part of getting old". Ailin is trying to have a positive outlook on the aging process, but says some days it can be tough to accept. [name] denies need for counseling or additional support at this time.      Barriers   Psychosocial barriers to participate in program There are no identifiable barriers or psychosocial needs.;The patient should benefit from training in stress management and relaxation.      Screening Interventions   Interventions Encouraged to exercise;To provide support and resources with identified psychosocial needs;Provide feedback about the scores to participant    Expected Outcomes Short Term goal: Identification and review with participant of any Quality of Life or Depression concerns found by scoring the questionnaire.;Long Term goal: The participant improves quality of Life and PHQ9 Scores as seen by post scores and/or verbalization of changes             Quality of Life Scores:  Quality of Life - 10/07/22 1223       Quality of Life   Select Quality of Life      Quality of Life Scores   Health/Function Pre 17.36 %    Socioeconomic Pre 25.5 %    Psych/Spiritual Pre 23.79 %    Family Pre 11.63 %    GLOBAL Pre 19.89 %            Scores of 19 and below usually indicate a poorer quality of life in these areas.  A difference of  2-3 points is a clinically meaningful  difference.  A difference of 2-3 points in the total score of the Quality of Life Index has been associated with significant improvement in overall quality of life, self-image, physical symptoms, and general health in studies assessing change in quality of life.  PHQ-9: Review Flowsheet       10/07/2022  Depression screen PHQ 2/9  Decreased Interest 0  Down, Depressed, Hopeless 1  PHQ - 2 Score 1  Altered  sleeping 2  Tired, decreased energy 3  Change in appetite 1  Feeling bad or failure about yourself  0  Trouble concentrating 0  Moving slowly or fidgety/restless 1  Suicidal thoughts 0  PHQ-9 Score 8  Difficult doing work/chores Not difficult at all   Interpretation of Total Score  Total Score Depression Severity:  1-4 = Minimal depression, 5-9 = Mild depression, 10-14 = Moderate depression, 15-19 = Moderately severe depression, 20-27 = Severe depression   Psychosocial Evaluation and Intervention:   Psychosocial Re-Evaluation:  Psychosocial Re-Evaluation     [place] Name 10/15/22 1642 10/20/22 1324           Psychosocial Re-Evaluation   Current issues with Current Sleep Concerns Current Sleep Concerns      Comments Euleta did not voice any increased concnerns or stressors on her first day of exercise. Will review QOL and PHQ-9 in the upcoming week Quality of life questionnare reviewed. Caidynce denies being depressed cureently. Euphemia says that arthritis pain sometimes limits what she can do.      Expected Outcomes Rhiann will have decreased stressors/ concerns upon completion of intensive cardiac rehab Casey will have decreased stressors/ concerns upon completion of intensive cardiac rehab      Interventions Stress management education;Encouraged to attend Cardiac Rehabilitation for the exercise Stress management education;Encouraged to attend Cardiac Rehabilitation for the exercise      Continue Psychosocial Services  Follow up required by staff No Follow up required                Psychosocial Discharge (Final Psychosocial Re-Evaluation):  Psychosocial Re-Evaluation - 10/20/22 1324       Psychosocial Re-Evaluation   Current issues with Current Sleep Concerns    Comments Quality of life questionnare reviewed. Quanetta denies being depressed cureently. Naleigha says that arthritis pain sometimes limits what she can do.    Expected Outcomes Kimari will have decreased stressors/ concerns upon completion of intensive cardiac rehab    Interventions Stress management education;Encouraged to attend Cardiac Rehabilitation for the exercise    Continue Psychosocial Services  No Follow up required             Vocational Rehabilitation: Provide vocational rehab assistance to qualifying candidates.   Vocational Rehab Evaluation & Intervention:  Vocational Rehab - 10/07/22 1226       Initial Vocational Rehab Evaluation & Intervention   Assessment shows need for Vocational Rehabilitation No   Momo is retired and does not need vocational rehab at this time            Education: Education Goals: Education classes will be provided on a weekly basis, covering required topics. Participant will state understanding/return demonstration of topics presented.    Education     Row Name 10/13/22 1600     Education   Cardiac Education Topics [employer]   Educator Dietitian   Weekly Topic Tasty Appetizers and Snacks   Instruction Review Code 1- Verbalizes Understanding   Class Start Time 1402   Class Stop Time 1448   Class Time Calculation (min) 46 min    [place] Name 10/18/22 1500     Education   Cardiac Education Topics [place]   Environmental consultant Exercise   Exercise Workshop Exercise Basics: Press photographer   Instruction Review Code 1- Verbalizes Understanding   Class Start Time 1359  Class Stop Time 1445   Class Time Calculation (min) 46 min             Core Videos: Exercise    Move It!  Clinical staff conducted group or individual video education with verbal and written material and guidebook.  Patient learns the recommended Pritikin exercise program. Exercise with the goal of living a long, healthy life. Some of the health benefits of exercise include controlled diabetes, healthier blood pressure levels, improved cholesterol levels, improved heart and lung capacity, improved sleep, and better body composition. Everyone should speak with their doctor before starting or changing an exercise routine.  Biomechanical Limitations Clinical staff conducted group or individual video education with verbal and written material and guidebook.  Patient learns how biomechanical limitations can impact exercise and how we can mitigate and possibly overcome limitations to have an impactful and balanced exercise routine.  Body Composition Clinical staff conducted group or individual video education with verbal and written material and guidebook.  Patient learns that body composition (ratio of muscle mass to fat mass) is a key component to assessing overall fitness, rather than body weight alone. Increased fat mass, especially visceral belly fat, can put [place] at increased risk for metabolic syndrome, type 2 diabetes, heart disease, and even death. It is recommended to combine diet and exercise (cardiovascular and resistance training) to improve your body composition. Seek guidance from your physician and exercise physiologist before implementing an exercise routine.  Exercise Action Plan Clinical staff conducted group or individual video education with verbal and written material and guidebook.  Patient learns the recommended strategies to achieve and enjoy long-term exercise adherence, including variety, self-motivation, self-efficacy, and positive decision making. Benefits of exercise include fitness, good health, weight management, more energy,  better sleep, less stress, and overall well-being.  Medical   Heart Disease Risk Reduction Clinical staff conducted group or individual video education with verbal and written material and guidebook.  Patient learns our heart is our most vital organ as it circulates oxygen, nutrients, white blood cells, and hormones throughout the entire body, and carries waste away. Data supports a plant-based eating plan like the Pritikin Program for its effectiveness in slowing progression of and reversing heart disease. The video provides a number of recommendations to address heart disease.   Metabolic Syndrome and Belly Fat  Clinical staff conducted group or individual video education with verbal and written material and guidebook.  Patient learns what metabolic syndrome is, how it leads to heart disease, and how one can reverse it and keep it from coming back. You have metabolic syndrome if you have 3 of the following 5 criteria: abdominal obesity, high blood pressure, high triglycerides, low HDL cholesterol, and high blood sugar.  Hypertension and Heart Disease Clinical staff conducted group or individual video education with verbal and written material and guidebook.  Patient learns that high blood pressure, or hypertension, is very common in the [place]. Hypertension is largely due to excessive salt intake, but other important risk factors include being overweight, physical inactivity, drinking too much alcohol, smoking, and not eating enough potassium from fruits and vegetables. High blood pressure is a leading risk factor for heart attack, stroke, congestive heart failure, dementia, kidney failure, and premature death. Long-term effects of excessive salt intake include stiffening of the arteries and thickening of heart muscle and organ damage. Recommendations include ways to reduce hypertension and the risk of heart disease.  Diseases of Our Time - Focusing on Diabetes Clinical staff conducted group  or individual video education with verbal and written material and guidebook.  Patient learns why the best way to stop diseases of our time is prevention, through food and other lifestyle changes. Medicine (such as prescription pills and surgeries) is often only a Band-Aid on the problem, not a long-term solution. Most common diseases of our time include obesity, type 2 diabetes, hypertension, heart disease, and cancer. The Pritikin Program is recommended and has been proven to help reduce, reverse, and/or prevent the damaging effects of metabolic syndrome.  Nutrition   Overview of the Pritikin Eating Plan  Clinical staff conducted group or individual video education with verbal and written material and guidebook.  Patient learns about the [place] for disease risk reduction. The [place] emphasizes a wide variety of unrefined, minimally-processed carbohydrates, like fruits, vegetables, whole grains, and legumes. Go, Caution, and Stop food choices are explained. Plant-based and lean animal proteins are emphasized. Rationale provided for low sodium intake for blood pressure control, low added sugars for blood sugar stabilization, and low added fats and oils for coronary artery disease risk reduction and weight management.  Calorie Density  Clinical staff conducted group or individual video education with verbal and written material and guidebook.  Patient learns about calorie density and how it impacts the Pritikin Eating Plan. Knowing the characteristics of the food you choose will help you decide whether those foods will lead to weight gain or weight loss, and whether you want to consume more or less of them. Weight loss is usually a side effect of the Pritikin Eating Plan because of its focus on low calorie-dense foods.  Label Reading  Clinical staff conducted group or individual video education with verbal and written material and guidebook.  Patient learns about the Pritikin  recommended label reading guidelines and corresponding recommendations regarding calorie density, added sugars, sodium content, and whole grains.  Dining Out - Part 1  Clinical staff conducted group or individual video education with verbal and written material and guidebook.  Patient learns that restaurant meals can be sabotaging because they can be so high in calories, fat, sodium, and/or sugar. Patient learns recommended strategies on how to positively address this and avoid unhealthy pitfalls.  Facts on Fats  Clinical staff conducted group or individual video education with verbal and written material and guidebook.  Patient learns that lifestyle modifications can be just as effective, if not more so, as many medications for lowering your risk of heart disease. A Pritikin lifestyle can help to reduce your risk of inflammation and atherosclerosis (cholesterol build-up, or plaque, in the artery walls). Lifestyle interventions such as dietary choices and physical activity address the cause of atherosclerosis. A review of the types of fats and their impact on blood cholesterol levels, along with dietary recommendations to reduce fat intake is also included.  Nutrition Action Plan  Clinical staff conducted group or individual video education with verbal and written material and guidebook.  Patient learns how to incorporate Pritikin recommendations into their lifestyle. Recommendations include planning and keeping personal health goals in mind as an important part of their success.  Healthy Mind-Set    Healthy Minds, Bodies, Hearts  Clinical staff conducted group or individual video education with verbal and written material and guidebook.  Patient learns how to identify when they are stressed. Video will discuss the impact of that stress, as well as the many benefits of stress management. Patient will also be introduced to stress management techniques. The way we think, act, and  feel has an impact on  our hearts.  How Our Thoughts Can Heal Our Hearts  Clinical staff conducted group or individual video education with verbal and written material and guidebook.  Patient learns that negative thoughts can cause depression and anxiety. This can result in negative lifestyle behavior and serious health problems. Cognitive behavioral therapy is an effective method to help control our thoughts in order to change and improve our emotional outlook.  Additional Videos:  Exercise    Improving Performance  Clinical staff conducted group or individual video education with verbal and written material and guidebook.  Patient learns to use a non-linear approach by alternating intensity levels and lengths of time spent exercising to help burn more calories and lose more body fat. Cardiovascular exercise helps improve heart health, metabolism, hormonal balance, blood sugar control, and recovery from fatigue. Resistance training improves strength, endurance, balance, coordination, reaction time, metabolism, and muscle mass. Flexibility exercise improves circulation, posture, and balance. Seek guidance from your physician and exercise physiologist before implementing an exercise routine and learn your capabilities and proper form for all exercise.  Introduction to Yoga  Clinical staff conducted group or individual video education with verbal and written material and guidebook.  Patient learns about yoga, a discipline of the coming together of mind, breath, and body. The benefits of yoga include improved flexibility, improved range of motion, better posture and core strength, increased lung function, weight loss, and positive self-image. Yoga's heart health benefits include lowered blood pressure, healthier heart rate, decreased cholesterol and triglyceride levels, improved immune function, and reduced stress. Seek guidance from your physician and exercise physiologist before implementing an exercise routine and learn  your capabilities and proper form for all exercise.  Medical   Aging: Enhancing Your Quality of Life  Clinical staff conducted group or individual video education with verbal and written material and guidebook.  Patient learns key strategies and recommendations to stay in good physical health and enhance quality of life, such as prevention strategies, having an advocate, securing a [place], and keeping a list of medications and system for tracking them. It also discusses how to avoid risk for bone loss.  Biology of Weight Control  Clinical staff conducted group or individual video education with verbal and written material and guidebook.  Patient learns that weight gain occurs because we consume more calories than we burn (eating more, moving less). Even if your body weight is normal, you may have higher ratios of fat compared to muscle mass. Too much body fat puts you at increased risk for cardiovascular disease, heart attack, stroke, type 2 diabetes, and obesity-related cancers. In addition to exercise, following the [place] can help reduce your risk.  Decoding Lab Results  Clinical staff conducted group or individual video education with verbal and written material and guidebook.  Patient learns that lab test reflects one measurement whose values change over time and are influenced by many factors, including medication, stress, sleep, exercise, food, hydration, pre-existing medical conditions, and more. It is recommended to use the knowledge from this video to become more involved with your lab results and evaluate your numbers to speak with your doctor.   Diseases of Our Time - Overview  Clinical staff conducted group or individual video education with verbal and written material and guidebook.  Patient learns that according to the CDC, 50% to 70% of chronic diseases (such as obesity, type 2 diabetes, elevated lipids, hypertension, and heart disease)  are avoidable  through lifestyle improvements including healthier food choices, listening to satiety cues, and increased physical activity.  Sleep Disorders Clinical staff conducted group or individual video education with verbal and written material and guidebook.  Patient learns how good quality and duration of sleep are important to overall health and well-being. Patient also learns about sleep disorders and how they impact health along with recommendations to address them, including discussing with a physician.  Nutrition  Dining Out - Part 2 Clinical staff conducted group or individual video education with verbal and written material and guidebook.  Patient learns how to plan ahead and communicate in order to maximize their dining experience in a healthy and nutritious manner. Included are recommended food choices based on the type of restaurant the patient is visiting.   Fueling a Best boy conducted group or individual video education with verbal and written material and guidebook.  There is a strong connection between our food choices and our health. Diseases like obesity and type 2 diabetes are very prevalent and are in large-part due to lifestyle choices. The Pritikin Eating Plan provides plenty of food and hunger-curbing satisfaction. It is easy to follow, affordable, and helps reduce health risks.  Menu Workshop  Clinical staff conducted group or individual video education with verbal and written material and guidebook.  Patient learns that restaurant meals can sabotage health goals because they are often packed with calories, fat, sodium, and sugar. Recommendations include strategies to plan ahead and to communicate with the manager, chef, or server to help order a healthier meal.  Planning Your Eating Strategy  Clinical staff conducted group or individual video education with verbal and written material and guidebook.  Patient learns about the [place]  and its benefit of reducing the risk of disease. The [place] does not focus on calories. Instead, it emphasizes high-quality, nutrient-rich foods. By knowing the characteristics of the foods, we choose, we can determine their calorie density and make informed decisions.  Targeting Your Nutrition Priorities  Clinical staff conducted group or individual video education with verbal and written material and guidebook.  Patient learns that lifestyle habits have a tremendous impact on disease risk and progression. This video provides eating and physical activity recommendations based on your personal health goals, such as reducing LDL cholesterol, losing weight, preventing or controlling type 2 diabetes, and reducing high blood pressure.  Vitamins and Minerals  Clinical staff conducted group or individual video education with verbal and written material and guidebook.  Patient learns different ways to obtain key vitamins and minerals, including through a recommended healthy diet. It is important to discuss all supplements you take with your doctor.   Healthy Mind-Set    Smoking Cessation  Clinical staff conducted group or individual video education with verbal and written material and guidebook.  Patient learns that cigarette smoking and tobacco addiction pose a serious health risk which affects millions of people. Stopping smoking will significantly reduce the risk of heart disease, lung disease, and many forms of cancer. Recommended strategies for quitting are covered, including working with your doctor to develop a successful plan.  Culinary   Becoming a Financial trader conducted group or individual video education with verbal and written material and guidebook.  Patient learns that cooking at home can be healthy, cost-effective, quick, and puts them in control. Keys to cooking healthy recipes will include looking at your recipe, assessing your equipment needs, planning  ahead, making it simple, choosing cost-effective seasonal  ingredients, and limiting the use of added fats, salts, and sugars.  Cooking - Breakfast and Snacks  Clinical staff conducted group or individual video education with verbal and written material and guidebook.  Patient learns how important breakfast is to satiety and nutrition through the entire day. Recommendations include key foods to eat during breakfast to help stabilize blood sugar levels and to prevent overeating at meals later in the day. Planning ahead is also a key component.  Cooking - Human resources officer conducted group or individual video education with verbal and written material and guidebook.  Patient learns eating strategies to improve overall health, including an approach to cook more at home. Recommendations include thinking of animal protein as a side on your plate rather than center stage and focusing instead on lower calorie dense options like vegetables, fruits, whole grains, and plant-based proteins, such as beans. Making sauces in large quantities to freeze for later and leaving the skin on your vegetables are also recommended to maximize your experience.  Cooking - Healthy Salads and Dressing Clinical staff conducted group or individual video education with verbal and written material and guidebook.  Patient learns that vegetables, fruits, whole grains, and legumes are the foundations of the [place]. Recommendations include how to incorporate each of these in flavorful and healthy salads, and how to create homemade salad dressings. Proper handling of ingredients is also covered. Cooking - Soups and [employer] - Soups and Desserts Clinical staff conducted group or individual video education with verbal and written material and guidebook.  Patient learns that Pritikin soups and desserts make for easy, nutritious, and delicious snacks and meal components that are low in sodium, fat, sugar,  and calorie density, while high in vitamins, minerals, and filling fiber. Recommendations include simple and healthy ideas for soups and desserts.   Overview     The Pritikin Solution Program Overview Clinical staff conducted group or individual video education with verbal and written material and guidebook.  Patient learns that the results of the [place] Program have been documented in more than 100 articles published in peer-reviewed journals, and the benefits include reducing risk factors for (and, in some cases, even reversing) high cholesterol, high blood pressure, type 2 diabetes, obesity, and more! An overview of the three key pillars of the Pritikin Program will be covered: eating well, doing regular exercise, and having a healthy mind-set.  WORKSHOPS  Exercise: Exercise Basics: Building Your Action Plan Clinical staff led group instruction and group discussion with PowerPoint presentation and patient guidebook. To enhance the learning environment the use of posters, models and videos may be added. At the conclusion of this workshop, patients will comprehend the difference between physical activity and exercise, as well as the benefits of incorporating both, into their routine. Patients will understand the FITT (Frequency, Intensity, Time, and Type) principle and how to use it to build an exercise action plan. In addition, safety concerns and other considerations for exercise and cardiac rehab will be addressed by the presenter. The purpose of this lesson is to promote a comprehensive and effective weekly exercise routine in order to improve patients' overall level of fitness.   Managing Heart Disease: Your Path to a Healthier Heart Clinical staff led group instruction and group discussion with PowerPoint presentation and patient guidebook. To enhance the learning environment the use of posters, models and videos may be added.At the conclusion of this workshop, patients will understand  the anatomy and physiology of the heart.  Additionally, they will understand how Pritikin's three pillars impact the risk factors, the progression, and the management of heart disease.  The purpose of this lesson is to provide a high-level overview of the heart, heart disease, and how the Pritikin lifestyle positively impacts risk factors.  Exercise Biomechanics Clinical staff led group instruction and group discussion with PowerPoint presentation and patient guidebook. To enhance the learning environment the use of posters, models and videos may be added. Patients will learn how the structural parts of their bodies function and how these functions impact their daily activities, movement, and exercise. Patients will learn how to promote a neutral spine, learn how to manage pain, and identify ways to improve their physical movement in order to promote healthy living. The purpose of this lesson is to expose patients to common physical limitations that impact physical activity. Participants will learn practical ways to adapt and manage aches and pains, and to minimize their effect on regular exercise. Patients will learn how to maintain good posture while sitting, walking, and lifting.  Balance Training and Fall Prevention  Clinical staff led group instruction and group discussion with PowerPoint presentation and patient guidebook. To enhance the learning environment the use of posters, models and videos may be added. At the conclusion of this workshop, patients will understand the importance of their sensorimotor skills (vision, proprioception, and the vestibular system) in maintaining their ability to balance as they age. Patients will apply a variety of balancing exercises that are appropriate for their current level of function. Patients will understand the common causes for poor balance, possible solutions to these problems, and ways to modify their physical environment in order to minimize  their fall risk. The purpose of this lesson is to teach patients about the importance of maintaining balance as they age and ways to minimize their risk of falling.  WORKSHOPS   Nutrition:  Fueling a Scientist, research (physical sciences) led group instruction and group discussion with PowerPoint presentation and patient guidebook. To enhance the learning environment the use of posters, models and videos may be added. Patients will review the foundational principles of the [place] and understand what constitutes a serving size in each of the food groups. Patients will also learn Pritikin-friendly foods that are better choices when away from home and review make-ahead meal and snack options. Calorie density will be reviewed and applied to three nutrition priorities: weight maintenance, weight loss, and weight gain. The purpose of this lesson is to reinforce (in a group setting) the key concepts around what patients are recommended to eat and how to apply these guidelines when away from home by planning and selecting Pritikin-friendly options. Patients will understand how calorie density may be adjusted for different weight management goals.  Mindful Eating  Clinical staff led group instruction and group discussion with PowerPoint presentation and patient guidebook. To enhance the learning environment the use of posters, models and videos may be added. Patients will briefly review the concepts of the [place] and the importance of low-calorie dense foods. The concept of mindful eating will be introduced as well as the importance of paying attention to internal hunger signals. Triggers for non-hunger eating and techniques for dealing with triggers will be explored. The purpose of this lesson is to provide patients with the opportunity to review the basic principles of the [place], discuss the value of eating mindfully and how to measure internal cues of hunger and fullness using the  Hunger Scale. Patients will also  discuss reasons for non-hunger eating and learn strategies to use for controlling emotional eating.  Targeting Your Nutrition Priorities Clinical staff led group instruction and group discussion with PowerPoint presentation and patient guidebook. To enhance the learning environment the use of posters, models and videos may be added. Patients will learn how to determine their genetic susceptibility to disease by reviewing their family history. Patients will gain insight into the importance of diet as part of an overall healthy lifestyle in mitigating the impact of genetics and other environmental insults. The purpose of this lesson is to provide patients with the opportunity to assess their personal nutrition priorities by looking at their family history, their own health history and current risk factors. Patients will also be able to discuss ways of prioritizing and modifying the [place] for their highest risk areas  Menu  Clinical staff led group instruction and group discussion with PowerPoint presentation and patient guidebook. To enhance the learning environment the use of posters, models and videos may be added. Using menus brought in from [employer], or printed from [employer], patients will apply the [place] dining out guidelines that were presented in the [employer] video. Patients will also be able to practice these guidelines in a variety of provided scenarios. The purpose of this lesson is to provide patients with the opportunity to practice hands-on learning of the [place] with actual menus and practice scenarios.  Label Reading Clinical staff led group instruction and group discussion with PowerPoint presentation and patient guidebook. To enhance the learning environment the use of posters, models and videos may be added. Patients will review and discuss the Pritikin label reading guidelines  presented in Pritikin's Label Reading Educational series video. Using fool labels brought in from local grocery stores and markets, patients will apply the label reading guidelines and determine if the packaged food meet the Pritikin guidelines. The purpose of this lesson is to provide patients with the opportunity to review, discuss, and practice hands-on learning of the Pritikin Label Reading guidelines with actual packaged food labels. [place] Workshops are designed to teach patients ways to prepare quick, simple, and affordable recipes at home. The importance of nutrition's role in chronic disease risk reduction is reflected in its emphasis in the overall Pritikin program. By learning how to prepare essential core Pritikin Eating Plan recipes, patients will increase control over what they eat; be able to customize the flavor of foods without the use of added salt, sugar, or fat; and improve the quality of the food they consume. By learning a set of core recipes which are easily assembled, quickly prepared, and affordable, patients are more likely to prepare more healthy foods at home. These workshops focus on convenient breakfasts, simple entres, side dishes, and desserts which can be prepared with minimal effort and are consistent with nutrition recommendations for cardiovascular risk reduction. Cooking [employer] are taught by a Engineer, materials (RD) who has been trained by the [employer]. The chef or RD has a clear understanding of the importance of minimizing - if not completely eliminating - added fat, sugar, and sodium in recipes. Throughout the series of [place] Workshop sessions, patients will learn about healthy ingredients and efficient methods of cooking to build confidence in their capability to prepare    Cooking School weekly topics:  Adding Flavor- Sodium-Free  Fast and Healthy Breakfasts  Powerhouse Plant-Based  Proteins  Satisfying Salads and Dressings  Simple Sides and Sauces  International Cuisine-Spotlight on the [place] Zones  Delicious Desserts  Savory Soups  [employer] - Meals in a Snap  Tasty Appetizers and Snacks  Comforting Weekend Breakfasts  One-Pot Wonders   Fast Evening Meals  Easy Entertaining  Personalizing Your Pritikin Plate  WORKSHOPS   Healthy Mindset (Psychosocial): New Thoughts, New Behaviors Clinical staff led group instruction and group discussion with PowerPoint presentation and patient guidebook. To enhance the learning environment the use of posters, models and videos may be added. Patients will learn and practice techniques for developing effective health and lifestyle goals. Patients will be able to effectively apply the goal setting process learned to develop at least one new personal goal.  The purpose of this lesson is to expose patients to a new skill set of behavior modification techniques such as techniques setting SMART goals, overcoming barriers, and achieving new thoughts and new behaviors.  Managing Moods and Relationships Clinical staff led group instruction and group discussion with PowerPoint presentation and patient guidebook. To enhance the learning environment the use of posters, models and videos may be added. Patients will learn how emotional and chronic stress factors can impact their health and relationships. They will learn healthy ways to manage their moods and utilize positive coping mechanisms. In addition, ICR patients will learn ways to improve communication skills. The purpose of this lesson is to expose patients to ways of understanding how one's mood and health are intimately connected. Developing a healthy outlook can help build positive relationships and connections with others. Patients will understand the importance of utilizing effective communication skills that include actively listening and being heard. They will learn and  understand the importance of the "4 Cs" and especially Connections in fostering of a Healthy Mind-Set.  Healthy Sleep for a Healthy Heart Clinical staff led group instruction and group discussion with PowerPoint presentation and patient guidebook. To enhance the learning environment the use of posters, models and videos may be added. At the conclusion of this workshop, patients will be able to demonstrate knowledge of the importance of sleep to overall health, well-being, and quality of life. They will understand the symptoms of, and treatments for, common sleep disorders. Patients will also be able to identify daytime and nighttime behaviors which impact sleep, and they will be able to apply these tools to help manage sleep-related challenges. The purpose of this lesson is to provide patients with a general overview of sleep and outline the importance of quality sleep. Patients will learn about a few of the most common sleep disorders. Patients will also be introduced to the concept of "sleep hygiene," and discover ways to self-manage certain sleeping problems through simple daily behavior changes. Finally, the workshop will motivate patients by clarifying the links between quality sleep and their goals of heart-healthy living.   Recognizing and Reducing Stress Clinical staff led group instruction and group discussion with PowerPoint presentation and patient guidebook. To enhance the learning environment the use of posters, models and videos may be added. At the conclusion of this workshop, patients will be able to understand the types of stress reactions, differentiate between acute and chronic stress, and recognize the impact that chronic stress has on their health. They will also be able to apply different coping mechanisms, such as reframing negative self-talk. Patients will have the opportunity to practice a variety of stress management techniques, such as deep abdominal breathing, progressive muscle  relaxation, and/or guided imagery.  The purpose of this lesson is to educate  patients on the role of stress in their lives and to provide healthy techniques for coping with it.  Learning Barriers/Preferences:  Learning Barriers/Preferences - 10/07/22 1224       Learning Barriers/Preferences   Learning Barriers Sight;Hearing   wears glasses, in process of getting hearing aids   Learning Preferences Audio;Computer/Internet;Individual Instruction;Group Instruction;Pictoral;Skilled Demonstration;Verbal Instruction;Video;Written Material             Education Topics:  Knowledge Questionnaire Score:  Knowledge Questionnaire Score - 10/07/22 1226       Knowledge Questionnaire Score   Pre Score 19/24             Core Components/Risk Factors/Patient Goals at Admission:  Personal Goals and Risk Factors at Admission - 10/07/22 1226       Core Components/Risk Factors/Patient Goals on Admission   Hypertension Yes    Intervention Provide education on lifestyle modifcations including regular physical activity/exercise, weight management, moderate sodium restriction and increased consumption of fresh fruit, vegetables, and low fat dairy, alcohol moderation, and smoking cessation.;Monitor prescription use compliance.    Expected Outcomes Short Term: Continued assessment and intervention until BP is < 140/39m HG in hypertensive participants. < 130/846mHG in hypertensive participants with diabetes, heart failure or chronic kidney disease.;Long Term: Maintenance of blood pressure at goal levels.    Lipids Yes    Intervention Provide education and support for participant on nutrition & aerobic/resistive exercise along with prescribed medications to achieve LDL '70mg'$ , HDL >'40mg'$ .    Expected Outcomes Short Term: Participant states understanding of desired cholesterol values and is compliant with medications prescribed. Participant is following exercise prescription and nutrition guidelines.;Long  Term: Cholesterol controlled with medications as prescribed, with individualized exercise RX and with personalized nutrition plan. Value goals: LDL < '70mg'$ , HDL > 40 mg.             Core Components/Risk Factors/Patient Goals Review:   Goals and Risk Factor Review     Row Name 10/15/22 1647 10/20/22 1327           Core Components/Risk Factors/Patient Goals Review   Personal Goals Review Hypertension;Lipids Hypertension;Lipids      Review Ardine started intensive cardiac rehab on 10/15/22 and did well with exercise DiJeannemaries off to a good start to exercise for her fitness level at  intensive cardiac rehab. DiToleenas chronic athritis pain.      Expected Outcomes Lamaria will continue to participate in intensive cardiac rehab for exercise, nutrition and lifestyle modifications Bonna will continue to participate in intensive cardiac rehab for exercise, nutrition and lifestyle modifications               Core Components/Risk Factors/Patient Goals at Discharge (Final Review):   Goals and Risk Factor Review - 10/20/22 1327       Core Components/Risk Factors/Patient Goals Review   Personal Goals Review Hypertension;Lipids    Review DiCamilles off to a good start to exercise for her fitness level at  intensive cardiac rehab. DiLatriseas chronic athritis pain.    Expected Outcomes Gracie will continue to participate in intensive cardiac rehab for exercise, nutrition and lifestyle modifications             ITP Comments:  ITP Comments     Row Name 10/07/22 0955 10/15/22 1639 10/20/22 1322       ITP Comments Dr. TrFransico Himedical director. Introduction to pritikin education program/ intensive cardiac rehab. Initial orientation packet reviewed with patient. 30 Day ITP Review. Aidynn started 3019  Day ITP Review. Bama is off to a good start to exercise at intensive cardiac rehab              Comments: See ITP comments.Harrell Gave RN BSN

## 2022-10-22 ENCOUNTER — Encounter (HOSPITAL_COMMUNITY)
Admission: RE | Admit: 2022-10-22 | Discharge: 2022-10-22 | Disposition: A | Discharge status: Home / Self Care | Payer: Medicare Other | Source: Ambulatory Visit | Attending: Cardiovascular Disease | Admitting: Cardiovascular Disease

## 2022-10-22 DIAGNOSIS — Z955 Presence of coronary angioplasty implant and graft: Secondary | ICD-10-CM

## 2022-10-22 DIAGNOSIS — Z48812 Encounter for surgical aftercare following surgery on the circulatory system: Secondary | ICD-10-CM | POA: Insufficient documentation

## 2022-10-25 ENCOUNTER — Encounter (HOSPITAL_COMMUNITY)
Admission: RE | Admit: 2022-10-25 | Discharge: 2022-10-25 | Disposition: A | Discharge status: Home / Self Care | Payer: Medicare Other | Source: Ambulatory Visit | Attending: Cardiovascular Disease | Admitting: Cardiovascular Disease

## 2022-10-25 DIAGNOSIS — Z955 Presence of coronary angioplasty implant and graft: Secondary | ICD-10-CM

## 2022-10-25 NOTE — Progress Notes (Signed)
Incomplete Session Note  Patient Details  Name: Alyssa Jacobs MRN: [id number] Date of Birth: Nov 13, 1940 Referring Provider:   Flowsheet Row INTENSIVE CARDIAC REHAB ORIENT from 10/07/2022 in [facility]  Referring Provider Eleonore Chiquito, MD      [name] did not complete her rehab session.  Maggi did not stay for exercise today as she had to leave to see her primary care provider regarding a urinary tract infection.Barnet Pall, RN,BSN 10/25/2022 4:39 PM

## 2022-10-26 DIAGNOSIS — N390 Urinary tract infection, site not specified: Secondary | ICD-10-CM | POA: Diagnosis not present

## 2022-10-26 DIAGNOSIS — R300 Dysuria: Secondary | ICD-10-CM | POA: Diagnosis not present

## 2022-10-27 ENCOUNTER — Encounter (HOSPITAL_COMMUNITY)
Admission: RE | Admit: 2022-10-27 | Discharge: 2022-10-27 | Disposition: A | Discharge status: Home / Self Care | Payer: Medicare Other | Source: Ambulatory Visit | Attending: Cardiovascular Disease | Admitting: Cardiovascular Disease

## 2022-10-27 DIAGNOSIS — Z955 Presence of coronary angioplasty implant and graft: Secondary | ICD-10-CM | POA: Diagnosis not present

## 2022-10-27 DIAGNOSIS — Z48812 Encounter for surgical aftercare following surgery on the circulatory system: Secondary | ICD-10-CM | POA: Diagnosis not present

## 2022-10-29 ENCOUNTER — Encounter (HOSPITAL_COMMUNITY)
Admission: RE | Admit: 2022-10-29 | Discharge: 2022-10-29 | Disposition: A | Discharge status: Home / Self Care | Payer: Medicare Other | Source: Ambulatory Visit | Attending: Cardiovascular Disease | Admitting: Cardiovascular Disease

## 2022-10-29 DIAGNOSIS — Z955 Presence of coronary angioplasty implant and graft: Secondary | ICD-10-CM

## 2022-10-29 DIAGNOSIS — Z48812 Encounter for surgical aftercare following surgery on the circulatory system: Secondary | ICD-10-CM | POA: Diagnosis not present

## 2022-10-29 NOTE — Progress Notes (Signed)
CARDIAC REHAB PHASE 2  Reviewed home exercise with pt today. Pt is tolerating exercise well. Pt will continue to exercise on her own by walking for 30-45 minutes per session 3-4 days a week in addition to the 3 days in [facility]2. Advised pt on THRR, RPE scale, hydration and temperature/humidity precautions. Reinforced NTG use, S/S to stop exercise and when to call MD vs 911. Encouraged warm up cool down and stretches with exercise sessions. Pt verbalized understanding, all questions were answered and pt was given a copy to take home.    Kirby Funk ACSM-CEP 10/29/2022 4:44 PM

## 2022-11-01 ENCOUNTER — Encounter (HOSPITAL_COMMUNITY)
Admission: RE | Admit: 2022-11-01 | Discharge: 2022-11-01 | Disposition: A | Discharge status: Home / Self Care | Payer: Medicare Other | Source: Ambulatory Visit | Attending: Cardiovascular Disease | Admitting: Cardiovascular Disease

## 2022-11-01 DIAGNOSIS — Z955 Presence of coronary angioplasty implant and graft: Secondary | ICD-10-CM

## 2022-11-01 NOTE — Progress Notes (Signed)
Incomplete Session Note  Patient Details  Name: Alyssa Jacobs MRN: [id number] Date of Birth: 1941-02-09 Referring Provider:   Flowsheet Row INTENSIVE CARDIAC REHAB ORIENT from 10/07/2022 in [facility]  Referring Provider Eleonore Chiquito, MD       [name] did not complete her rehab session.  Patient reports having cramping and discharge from her recent pessary insertion. [name] decided not to exercise today. [name] has a doctor's appointment tomorrow and an appointment with the urologist on Friday. Will cancel appointments for the rest of the week. [name] hopes to return to exercise on next Monday.Harrell Gave RN BSN

## 2022-11-03 ENCOUNTER — Encounter (HOSPITAL_COMMUNITY): Payer: Medicare Other

## 2022-11-03 DIAGNOSIS — M179 Osteoarthritis of knee, unspecified: Secondary | ICD-10-CM | POA: Diagnosis not present

## 2022-11-03 DIAGNOSIS — K219 Gastro-esophageal reflux disease without esophagitis: Secondary | ICD-10-CM | POA: Diagnosis not present

## 2022-11-03 DIAGNOSIS — I700 Atherosclerosis of aorta: Secondary | ICD-10-CM | POA: Diagnosis not present

## 2022-11-03 DIAGNOSIS — K2270 Barrett's esophagus without dysplasia: Secondary | ICD-10-CM | POA: Diagnosis not present

## 2022-11-03 DIAGNOSIS — R5383 Other fatigue: Secondary | ICD-10-CM | POA: Diagnosis not present

## 2022-11-03 DIAGNOSIS — E785 Hyperlipidemia, unspecified: Secondary | ICD-10-CM | POA: Diagnosis not present

## 2022-11-03 DIAGNOSIS — N8110 Cystocele, unspecified: Secondary | ICD-10-CM | POA: Diagnosis not present

## 2022-11-03 DIAGNOSIS — M4807 Spinal stenosis, lumbosacral region: Secondary | ICD-10-CM | POA: Diagnosis not present

## 2022-11-05 ENCOUNTER — Encounter (HOSPITAL_COMMUNITY): Payer: Medicare Other

## 2022-11-05 DIAGNOSIS — K5904 Chronic idiopathic constipation: Secondary | ICD-10-CM | POA: Diagnosis not present

## 2022-11-08 ENCOUNTER — Encounter (HOSPITAL_COMMUNITY)
Admission: RE | Admit: 2022-11-08 | Discharge: 2022-11-08 | Disposition: A | Discharge status: Home / Self Care | Payer: Medicare Other | Source: Ambulatory Visit | Attending: Cardiovascular Disease | Admitting: Cardiovascular Disease

## 2022-11-08 DIAGNOSIS — Z48812 Encounter for surgical aftercare following surgery on the circulatory system: Secondary | ICD-10-CM | POA: Diagnosis not present

## 2022-11-08 DIAGNOSIS — Z955 Presence of coronary angioplasty implant and graft: Secondary | ICD-10-CM | POA: Diagnosis not present

## 2022-11-10 ENCOUNTER — Encounter (HOSPITAL_COMMUNITY)
Admission: RE | Admit: 2022-11-10 | Discharge: 2022-11-10 | Disposition: A | Discharge status: Home / Self Care | Payer: Medicare Other | Source: Ambulatory Visit | Attending: Cardiovascular Disease | Admitting: Cardiovascular Disease

## 2022-11-10 DIAGNOSIS — Z955 Presence of coronary angioplasty implant and graft: Secondary | ICD-10-CM | POA: Diagnosis not present

## 2022-11-10 DIAGNOSIS — Z48812 Encounter for surgical aftercare following surgery on the circulatory system: Secondary | ICD-10-CM | POA: Diagnosis not present

## 2022-11-12 ENCOUNTER — Encounter (HOSPITAL_COMMUNITY): Payer: Medicare Other

## 2022-11-15 ENCOUNTER — Encounter (HOSPITAL_COMMUNITY)
Admission: RE | Admit: 2022-11-15 | Discharge: 2022-11-15 | Disposition: A | Discharge status: Home / Self Care | Payer: Medicare Other | Source: Ambulatory Visit | Attending: Cardiovascular Disease | Admitting: Cardiovascular Disease

## 2022-11-15 DIAGNOSIS — Z955 Presence of coronary angioplasty implant and graft: Secondary | ICD-10-CM | POA: Diagnosis not present

## 2022-11-15 DIAGNOSIS — Z48812 Encounter for surgical aftercare following surgery on the circulatory system: Secondary | ICD-10-CM | POA: Diagnosis not present

## 2022-11-16 NOTE — Progress Notes (Signed)
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 Swallow whole., Disp: 90 tablet, Rfl: 1   atorvastatin (LIPITOR) 40 MG tablet, Take 1 tablet (40 mg total) by mouth daily., Disp: 90 tablet, Rfl: 3   azelastine (OPTIVAR) 0.05 % ophthalmic solution, Place 1 drop into both eyes daily as needed (allergies)., Disp: , Rfl:    betamethasone dipropionate 0.05 % cream, Apply 1 Application topically daily as needed (rash)., Disp: , Rfl:    cephALEXin (KEFLEX) 250 MG capsule, Take 250 mg by mouth 3 (three) times daily., Disp: , Rfl:    clopidogrel (PLAVIX) 75 MG tablet, Take 1 tablet (75 mg total) by mouth daily., Disp: 90 tablet, Rfl: 3   metoprolol tartrate (LOPRESSOR) 25 MG tablet, Take 1 tablet (25 mg total) by mouth 2 (two) times daily., Disp: 180 tablet, Rfl: 3   Multiple Vitamin (MULTIVITAMIN) capsule, Take 1 capsule by mouth daily., Disp: , Rfl:    nitroGLYCERIN (NITROSTAT) 0.4 MG SL tablet, Place 1 tablet (0.4 mg total) under the tongue every 5 (five) minutes as needed for chest pain. (Patient not taking: Reported on 08/27/2022), Disp: 25 tablet, Rfl: 3   omeprazole (PRILOSEC) 20 MG capsule, Take 20 mg by mouth every morning., Disp: , Rfl:     Vitamin D, Ergocalciferol, (DRISDOL) 1.25 MG (50000 UNIT) CAPS capsule, Take 50,000 Units by mouth 3 (three) times a week., Disp: , Rfl:   Past Medical History: Past Medical History:  Diagnosis Date   Arthritis    Frequency of urination    GERD (gastroesophageal reflux disease)    no meds currently   Nocturia    Psoriasis     Tobacco Use: Social History   Tobacco Use  Smoking Status Former   Types: Cigarettes   Quit date: 04/10/2000   Years since quitting: 22.6  Smokeless Tobacco Never    Labs: Review Flowsheet       Latest Ref Rng & Units 03/18/2022 05/06/2022 10/11/2022  Labs for ITP Cardiac and Pulmonary Rehab  Cholestrol 100 - 199 mg/dL 185  - 126   LDL (calc) 0 - 99 mg/dL 111  - 56   HDL-C >39 mg/dL 55  - 54   Trlycerides 0 - 149 mg/dL 106  - 85   TCO2 22 - 32 mmol/L - 21  -    Capillary Blood Glucose: No results found for: "GLUCAP"   Exercise Target Goals: Exercise Program Goal: Individual exercise prescription set using results from initial 6 min walk test and THRR while considering  patient's activity barriers and safety.   Exercise Prescription Goal: Initial exercise prescription builds to 30-45 minutes a day of aerobic activity, 2-3 days per week.  Home exercise guidelines  will be given to patient during program as part of exercise prescription that the participant will acknowledge.  Activity Barriers & Risk Stratification:  Activity Barriers & Cardiac Risk Stratification - 10/07/22 1219       Activity Barriers & Cardiac Risk Stratification   Activity Barriers Arthritis;Right Knee Replacement;Joint Problems;Shortness of Breath;Balance Concerns;History of Falls;Assistive Device;Deconditioning    Cardiac Risk Stratification High   <5 METs 6MWT            6 Minute Walk:  6 Minute Walk     Row Name 10/07/22 1218         6 Minute Walk   Phase Initial     Distance 979 feet     Walk Time 6 minutes     # of Rest Breaks 0     MPH 1.85      METS 1.56     RPE 11     Perceived Dyspnea  1     VO2 Peak 5.55     Symptoms No     Resting HR 76 bpm     Resting BP 118/68     Resting Oxygen Saturation  100 %     Exercise Oxygen Saturation  during 6 min walk 100 %     Max Ex. HR 94 bpm     Max Ex. BP 126/64     2 Minute Post BP 122/70              Oxygen Initial Assessment:   Oxygen Re-Evaluation:   Oxygen Discharge (Final Oxygen Re-Evaluation):   Initial Exercise Prescription:  Initial Exercise Prescription - 10/07/22 1200       Date of Initial Exercise RX and Referring Provider   Date 10/07/22    Referring Provider Eleonore Chiquito, MD    Expected Discharge Date 12/03/22      Recumbant Bike   Level 1    RPM 60    Minutes 15    METs 2      NuStep   Level 1    SPM 75    Minutes 15    METs 1.7      Prescription Details   Frequency (times per week) 3    Duration Progress to 30 minutes of continuous aerobic without signs/symptoms of physical distress      Intensity   THRR 40-80% of Max Heartrate 56-112    Ratings of Perceived Exertion 11-13    Perceived Dyspnea 0-4      Progression   Progression Continue to progress workloads to maintain intensity without signs/symptoms of physical distress.      Resistance Training   Training Prescription Yes    Weight 2    Reps 10-15             Perform Capillary Blood Glucose checks as needed.  Exercise Prescription Changes:   Exercise Prescription Changes     Row Name 10/13/22 1622 10/29/22 1636 11/15/22 1600         Response to Exercise   Blood Pressure (Admit) 122/72 116/70 106/62     Blood Pressure (Exercise) 132/70 148/78 118/72     Blood Pressure (Exit) 91/57  H2O given, recheck 113/72 104/80  H2O given, recheck 113/72 96/59     Heart Rate (Admit) 71 bpm 70 bpm 64 bpm     Heart Rate (Exercise) 85 bpm 98 bpm 89 bpm     Heart Rate (Exit) 74 bpm 79 bpm 72 bpm     Rating of Perceived  Exertion (Exercise) '11 11 12     '$ Perceived Dyspnea  (Exercise) 0 0 0     Symptoms 0 0 none     Comments Pt first day in the [facility]ikin program Reviewed MET's, goals and home ExRx Reviewed METs with pt     Duration Progress to 30 minutes of  aerobic without signs/symptoms of physical distress Progress to 30 minutes of  aerobic without signs/symptoms of physical distress Progress to 30 minutes of  aerobic without signs/symptoms of physical distress     Intensity THRR unchanged THRR unchanged THRR unchanged       Progression   Progression Continue to progress workloads to maintain intensity without signs/symptoms of physical distress. Continue to progress workloads to maintain intensity without signs/symptoms of physical distress. Continue to progress workloads to maintain intensity without signs/symptoms of physical distress.     Average METs 1.85 1.95 2.05       Resistance Training   Training Prescription Yes Yes Yes     Weight '2 2 2     '$ Reps 10-15 10-15 10-15     Time 10 Minutes 10 Minutes 10 Minutes       Interval Training   Interval Training -- -- No       Recumbant Bike   Level '1 1 2     '$ RPM -- 60 --     Watts -- 16 --     Minutes '15 15 15     '$ METs 1.7 1.7 2       NuStep   Level '1 1 1     '$ SPM 69 73 --     Minutes '15 15 15     '$ METs 2 2.2 2.2       Home Exercise Plan   Plans to continue exercise at -- Home (comment) --     Frequency -- Add 3 additional days to program exercise sessions. --     Initial Home Exercises Provided -- 10/29/22 --              Exercise Comments:   Exercise Comments     Row Name 10/13/22 1630 10/29/22 1643 11/15/22 1651       Exercise Comments Pt first day in the [place] program. Pt tolerated exercise well with an average MET level of 1.85. Pt is learning her THRR, RPE and ExRx. But pt felt good about her first day.  Will continue to monitor pt and progress workloads as tolerated without sign or symptom Reviewed MET's, goals and home ExRx. Pt tolerated exercise well with an average MET  level of 1.95. Pt is making slow progressions and talked more about increasing SPM and RPM to help achieve greater MET levels. Pt feels good about her goals so far and feels she is gaining strength, stamina and becoming more independent. Pt also states she feels her housework is becoming easier. Pt will exercise on her own 3-4 days by walking for 30-45 mins per session, also gave pt weight and stretches packet to use on her own. Reviewed METs with pt today, pt is exercising at an average MET level of 2.05 and tolerating it well. Pt increased her workload on the recumbant bike today and is excited about her progress. Pt states she gets frustrated by pain that limits her, but is eager to continue to make progress in program.              Exercise Goals and Review:   Exercise Goals  [place] Name 10/07/22 1222             Exercise Goals   Increase Physical Activity Yes       Intervention Provide advice, education, support and counseling about physical activity/exercise needs.;Develop an individualized exercise prescription for aerobic and resistive training based on initial evaluation findings, risk stratification, comorbidities and participant's personal goals.       Expected Outcomes Short Term: Attend rehab on a regular basis to increase amount of physical activity.;Long Term: Exercising regularly at least 3-5 days a week.;Long Term: Add in home exercise to make exercise part of routine and to increase amount of physical activity.       Increase Strength and Stamina Yes       Intervention Provide advice, education, support and counseling about physical activity/exercise needs.;Develop an individualized exercise prescription for aerobic and resistive training based on initial evaluation findings, risk stratification, comorbidities and participant's personal goals.       Expected Outcomes Short Term: Increase workloads from initial exercise prescription for resistance, speed, and METs.;Short  Term: Perform resistance training exercises routinely during rehab and add in resistance training at home;Long Term: Improve cardiorespiratory fitness, muscular endurance and strength as measured by increased METs and functional capacity (6MWT)       Able to understand and use rate of perceived exertion (RPE) scale Yes       Intervention Provide education and explanation on how to use RPE scale       Expected Outcomes Short Term: Able to use RPE daily in rehab to express subjective intensity level;Long Term:  Able to use RPE to guide intensity level when exercising independently       Knowledge and understanding of Target Heart Rate Range (THRR) Yes       Intervention Provide education and explanation of THRR including how the numbers were predicted and where they are located for reference       Expected Outcomes Short Term: Able to state/look up THRR;Short Term: Able to use daily as guideline for intensity in rehab;Long Term: Able to use THRR to govern intensity when exercising independently       Understanding of Exercise Prescription Yes       Intervention Provide education, explanation, and written materials on patient's individual exercise prescription       Expected Outcomes Short Term: Able to explain program exercise prescription;Long Term: Able to explain home exercise prescription to exercise independently                Exercise Goals Re-Evaluation :  Exercise Goals Re-Evaluation     Row Name 10/13/22 1625 10/29/22 1639           Exercise Goal Re-Evaluation   Exercise Goals Review Increase Physical Activity;Understanding of Exercise Prescription;Increase Strength and Stamina;Knowledge and understanding of Target Heart Rate Range (THRR);Able to understand and use rate of perceived exertion (RPE) scale Increase Physical Activity;Understanding of Exercise Prescription;Increase Strength and Stamina;Knowledge and understanding of Target Heart Rate Range (THRR);Able to understand and  use rate of perceived exertion (RPE) scale      Comments Pt first day in the [facility] program. Pt tolerated exercise well with an average MET level of 1.85. Pt is learning her THRR, RPE and ExRx. But pt felt good about her first day. Reviewed MET's, goals and home ExRx. Pt tolerated exercise well with an average MET level of 1.95. Pt is making slow progressions and talked more about increasing SPM and RPM to help achieve greater MET  levels. Pt feels good about her goals so far and feels she is gaining strength, stamina and becoming more independent. Pt also states she feels her housework is becoming easier. Pt will exercise on her own 3-4 days by walking for 30-45 mins per session, also gave pt weight and stretches packet to use on her own.      Expected Outcomes Will continue to monitor pt and progress workloads as tolerated without sign or symptom Will continue to monitor pt and progress workloads as tolerated without sign or symptom               Discharge Exercise Prescription (Final Exercise Prescription Changes):  Exercise Prescription Changes - 11/15/22 1600       Response to Exercise   Blood Pressure (Admit) 106/62    Blood Pressure (Exercise) 118/72    Blood Pressure (Exit) 96/59    Heart Rate (Admit) 64 bpm    Heart Rate (Exercise) 89 bpm    Heart Rate (Exit) 72 bpm    Rating of Perceived Exertion (Exercise) 12    Perceived Dyspnea (Exercise) 0    Symptoms none    Comments Reviewed METs with pt    Duration Progress to 30 minutes of  aerobic without signs/symptoms of physical distress    Intensity THRR unchanged      Progression   Progression Continue to progress workloads to maintain intensity without signs/symptoms of physical distress.    Average METs 2.05      Resistance Training   Training Prescription Yes    Weight 2    Reps 10-15    Time 10 Minutes      Interval Training   Interval Training No      Recumbant Bike   Level 2    Minutes 15    METs 2       NuStep   Level 1    Minutes 15    METs 2.2             Nutrition:  Target Goals: Understanding of nutrition guidelines, daily intake of sodium '1500mg'$ , cholesterol '200mg'$ , calories 30% from fat and 7% or less from saturated fats, daily to have 5 or more servings of fruits and vegetables.  Biometrics:  Pre Biometrics - 10/07/22 1215       Pre Biometrics   Waist Circumference 36.5 inches    Hip Circumference 44.75 inches    Waist to Hip Ratio 0.82 %    Triceps Skinfold 38 mm    % Body Fat 42.5 %    Grip Strength 22 kg    Flexibility 19.5 in    Single Leg Stand 10.43 seconds              Nutrition Therapy Plan and Nutrition Goals:  Nutrition Therapy & Goals - 11/12/22 1521       Nutrition Therapy   Diet Heart healthy diet    Drug/Food Interactions Statins/Certain Fruits      Personal Nutrition Goals   Nutrition Goal Patient to identify strategies for managing cardiovascular risk by attending the [facility] education and nutrition series weekly    Personal Goal #2 Patient to improve diet quality by using the plate method as a daily guide for meal planning to include lean protien/plant protein, fruits, vegetables, whole grains, and nonfat/lowfat dairy as part of balanced diet.    Comments Goals in action. Develyn continues to attend the [facility] education and nutrition series regularly. She is motivated to increase high fiber foods, reduce  sodium, and continues moderation of saturated fat. Bradley will benefit from participation in intensive cardiac rehab for nutrition, exercise, and lifestyle modification.      Intervention Plan   Intervention Prescribe, educate and counsel regarding individualized specific dietary modifications aiming towards targeted core components such as weight, hypertension, lipid management, diabetes, heart failure and other comorbidities.;Nutrition handout(s) given to patient.    Expected Outcomes Short Term Goal: Understand basic principles of  dietary content, such as calories, fat, sodium, cholesterol and nutrients.;Long Term Goal: Adherence to prescribed nutrition plan.             Nutrition Assessments:  Nutrition Assessments - 10/14/22 1146       Rate Your Plate Scores   Pre Score 61            MEDIFICTS Score Key: ?70 Need to make dietary changes  40-70 Heart Healthy Diet ? 40 Therapeutic Level Cholesterol Diet   Flowsheet Row INTENSIVE CARDIAC REHAB from 10/13/2022 in [facility]  Picture Your Plate Total Score on Admission 61      Picture Your Plate Scores: [id number] Unhealthy dietary pattern with much room for improvement. 41-50 Dietary pattern unlikely to meet recommendations for good health and room for improvement. 51-60 More healthful dietary pattern, with some room for improvement.  >60 Healthy dietary pattern, although there may be some specific behaviors that could be improved.    Nutrition Goals Re-Evaluation:  Nutrition Goals Re-Evaluation     [place] Name 10/14/22 0905 11/12/22 1521           Goals   Current Weight 173 lb 8 oz (78.7 kg) 172 lb 2.9 oz (78.1 kg)      Comment lipids WNL, potassium elevated 5.6- was rechecked yesterday and awaiting results/intervention. She does report chronic pain from arthiritis. lipids WNL, Potassium 5.4- monitored by cardiology      Expected Outcome Calla will benefit from participation in intensive cardiac rehab for nutrition, exercise, and lifestyle modification. Goals in action. Makyna continues to attend the [facility] education and nutrition series regularly. She is motivated to increase high fiber foods, reduce sodium, and continues moderation of saturated fat. Iwana will benefit from participation in intensive cardiac rehab for nutrition, exercise, and lifestyle modification.               Nutrition Goals Re-Evaluation:  Nutrition Goals Re-Evaluation     [place] Name 10/14/22 0905 11/12/22 1521            Goals   Current Weight 173 lb 8 oz (78.7 kg) 172 lb 2.9 oz (78.1 kg)      Comment lipids WNL, potassium elevated 5.6- was rechecked yesterday and awaiting results/intervention. She does report chronic pain from arthiritis. lipids WNL, Potassium 5.4- monitored by cardiology      Expected Outcome Keagan will benefit from participation in intensive cardiac rehab for nutrition, exercise, and lifestyle modification. Goals in action. Johnathan continues to attend the [facility] education and nutrition series regularly. She is motivated to increase high fiber foods, reduce sodium, and continues moderation of saturated fat. Kaydan will benefit from participation in intensive cardiac rehab for nutrition, exercise, and lifestyle modification.               Nutrition Goals Discharge (Final Nutrition Goals Re-Evaluation):  Nutrition Goals Re-Evaluation - 11/12/22 1521       Goals   Current Weight 172 lb 2.9 oz (78.1 kg)    Comment lipids WNL, Potassium  5.4- monitored by cardiology    Expected Outcome Goals in action. Dayanne continues to attend the [facility] education and nutrition series regularly. She is motivated to increase high fiber foods, reduce sodium, and continues moderation of saturated fat. Bentlei will benefit from participation in intensive cardiac rehab for nutrition, exercise, and lifestyle modification.             Psychosocial: Target Goals: Acknowledge presence or absence of significant depression and/or stress, maximize coping skills, provide positive support system. Participant is able to verbalize types and ability to use techniques and skills needed for reducing stress and depression.  Initial Review & Psychosocial Screening:  Initial Psych Review & Screening - 10/07/22 1016       Initial Review   Current issues with Current Sleep Concerns      Family Dynamics   Good Support System? Yes   [name] has her son and daughter for support.   Comments Orianna recently  moved in with her daughter, son in law, and grandchildren for health and safety reasons after a fall 5 years ago. She states that while this has been a big adjustment, she has tried to remain positive and has great communication with her daughter. She also enjoys the added time with her grandchildren who she adores. Shaelan has had some diffculty with sleeping at night, and some fatigue during the day which she attritubes to her arthritis pain and says "that's just part of getting old". Yanelli is trying to have a positive outlook on the aging process, but says some days it can be tough to accept. [name] denies need for counseling or additional support at this time.      Barriers   Psychosocial barriers to participate in program There are no identifiable barriers or psychosocial needs.;The patient should benefit from training in stress management and relaxation.      Screening Interventions   Interventions Encouraged to exercise;To provide support and resources with identified psychosocial needs;Provide feedback about the scores to participant    Expected Outcomes Short Term goal: Identification and review with participant of any Quality of Life or Depression concerns found by scoring the questionnaire.;Long Term goal: The participant improves quality of Life and PHQ9 Scores as seen by post scores and/or verbalization of changes             Quality of Life Scores:  Quality of Life - 10/07/22 1223       Quality of Life   Select Quality of Life      Quality of Life Scores   Health/Function Pre 17.36 %    Socioeconomic Pre 25.5 %    Psych/Spiritual Pre 23.79 %    Family Pre 11.63 %    GLOBAL Pre 19.89 %            Scores of 19 and below usually indicate a poorer quality of life in these areas.  A difference of  2-3 points is a clinically meaningful difference.  A difference of 2-3 points in the total score of the Quality of Life Index has been associated with significant improvement in  overall quality of life, self-image, physical symptoms, and general health in studies assessing change in quality of life.  PHQ-9: Review Flowsheet       10/07/2022  Depression screen PHQ 2/9  Decreased Interest 0  Down, Depressed, Hopeless 1  PHQ - 2 Score 1  Altered sleeping 2  Tired, decreased energy 3  Change in appetite 1  Feeling bad or failure about  yourself  0  Trouble concentrating 0  Moving slowly or fidgety/restless 1  Suicidal thoughts 0  PHQ-9 Score 8  Difficult doing work/chores Not difficult at all   Interpretation of Total Score  Total Score Depression Severity:  1-4 = Minimal depression, 5-9 = Mild depression, 10-14 = Moderate depression, 15-19 = Moderately severe depression, 20-27 = Severe depression   Psychosocial Evaluation and Intervention:   Psychosocial Re-Evaluation:  Psychosocial Re-Evaluation     [place] Name 10/15/22 1642 10/20/22 1324 11/16/22 1031         Psychosocial Re-Evaluation   Current issues with Current Sleep Concerns Current Sleep Concerns Current Sleep Concerns     Comments Attie did not voice any increased concnerns or stressors on her first day of exercise. Will review QOL and PHQ-9 in the upcoming week Quality of life questionnare reviewed. Yesly denies being depressed cureently. Rosamond says that arthritis pain sometimes limits what she can do. [name] has not voiced any further concerns or stressors. Aylina says that arthritis pain sometimes limits what she can do.     Expected Outcomes Cadi will have decreased stressors/ concerns upon completion of intensive cardiac rehab [place] will have decreased stressors/ concerns upon completion of intensive cardiac rehab [place] will have decreased stressors/ concerns upon completion of intensive cardiac rehab     Interventions Stress management education;Encouraged to attend Cardiac Rehabilitation for the exercise Stress management education;Encouraged to attend Cardiac Rehabilitation for the  exercise Stress management education;Encouraged to attend Cardiac Rehabilitation for the exercise     Continue Psychosocial Services  Follow up required by staff No Follow up required No Follow up required              Psychosocial Discharge (Final Psychosocial Re-Evaluation):  Psychosocial Re-Evaluation - 11/16/22 1031       Psychosocial Re-Evaluation   Current issues with Current Sleep Concerns    Comments [name] has not voiced any further concerns or stressors. Nierra says that arthritis pain sometimes limits what she can do.    Expected Outcomes Amiree will have decreased stressors/ concerns upon completion of intensive cardiac rehab    Interventions Stress management education;Encouraged to attend Cardiac Rehabilitation for the exercise    Continue Psychosocial Services  No Follow up required             Vocational Rehabilitation: Provide vocational rehab assistance to qualifying candidates.   Vocational Rehab Evaluation & Intervention:  Vocational Rehab - 10/07/22 1226       Initial Vocational Rehab Evaluation & Intervention   Assessment shows need for Vocational Rehabilitation No   Jovina is retired and does not need vocational rehab at this time            Education: Education Goals: Education classes will be provided on a weekly basis, covering required topics. Participant will state understanding/return demonstration of topics presented.    Education     [place] Name 10/13/22 1600     Education   Cardiac Education Topics [employer]   Educator Dietitian   Weekly Topic Tasty Appetizers and Snacks   Instruction Review Code 1- Verbalizes Understanding   Class Start Time 1402   Class Stop Time 1448   Class Time Calculation (min) 46 min    [place] Name 10/18/22 1500     Education   Cardiac Education Topics Pritikin   Environmental consultant Exercise  Exercise  Workshop Exercise Basics: Press photographer   Instruction Review Code 1- Verbalizes Understanding   Class Start Time 1359   Class Stop Time 1445   Class Time Calculation (min) 46 min    [place] Name 10/20/22 1600     Education   Cardiac Education Topics Pritikin   Financial trader   Weekly Topic Efficiency Cooking - Meals in a Snap   Instruction Review Code 1- Verbalizes Understanding   Class Start Time [id number]   Class Stop Time 1437   Class Time Calculation (min) 42 min    [place] Name 10/27/22 1600     Education   Cardiac Education Topics Pritikin   Financial trader   Weekly Topic One-Pot Wonders   Instruction Review Code 1- Verbalizes Understanding   Class Start Time 1400   Class Stop Time 1450   Class Time Calculation (min) 50 min    [place] Name 10/29/22 1600     Education   Cardiac Education Topics Pritikin   Architect Education   General Education Hypertension and Heart Disease   Instruction Review Code 1- Verbalizes Understanding   Class Start Time 1410   Class Stop Time 1450   Class Time Calculation (min) 40 min    [place] Name 11/01/22 1500     Education   Cardiac Education Topics Pritikin   Environmental consultant Psychosocial   Psychosocial Workshop Healthy Sleep for a Healthy Heart   Instruction Review Code 1- Verbalizes Understanding   Class Start Time 1400   Class Stop Time 1450   Class Time Calculation (min) 50 min    [place] Name 11/08/22 1600     Education   Cardiac Education Topics Pritikin   Academic librarian Exercise Education   Exercise Education Biomechanial Limitations   Instruction Review Code 1- Verbalizes Understanding   Class Start Time 1406   Class Stop Time  1442   Class Time Calculation (min) 36 min    [place] Name 11/10/22 1600     Education   Cardiac Education Topics Pritikin   Financial trader   Weekly Topic Fast Evening Meals   Instruction Review Code 1- Verbalizes Understanding   Class Start Time 1400   Class Stop Time 1445   Class Time Calculation (min) 45 min    [place] Name 11/15/22 1500     Education   Cardiac Education Topics Pritikin   Financial trader   Weekly Topic International Cuisine- Spotlight on the [place]   Instruction Review Code 1- Verbalizes Understanding   Class Start Time 1405   Class Stop Time 1453   Class Time Calculation (min) 48 min            Core Videos: Exercise    Move It!  Clinical staff conducted group or individual video education with verbal and written material and guidebook.  Patient learns the recommended Pritikin exercise program. Exercise with the goal of living a long, healthy life. Some of the health benefits of exercise include  controlled diabetes, healthier blood pressure levels, improved cholesterol levels, improved heart and lung capacity, improved sleep, and better body composition. Everyone should speak with their doctor before starting or changing an exercise routine.  Biomechanical Limitations Clinical staff conducted group or individual video education with verbal and written material and guidebook.  Patient learns how biomechanical limitations can impact exercise and how we can mitigate and possibly overcome limitations to have an impactful and balanced exercise routine.  Body Composition Clinical staff conducted group or individual video education with verbal and written material and guidebook.  Patient learns that body composition (ratio of muscle mass to fat mass) is a key component to assessing overall fitness, rather than body weight alone. Increased fat mass, especially visceral  belly fat, can put [place] at increased risk for metabolic syndrome, type 2 diabetes, heart disease, and even death. It is recommended to combine diet and exercise (cardiovascular and resistance training) to improve your body composition. Seek guidance from your physician and exercise physiologist before implementing an exercise routine.  Exercise Action Plan Clinical staff conducted group or individual video education with verbal and written material and guidebook.  Patient learns the recommended strategies to achieve and enjoy long-term exercise adherence, including variety, self-motivation, self-efficacy, and positive decision making. Benefits of exercise include fitness, good health, weight management, more energy, better sleep, less stress, and overall well-being.  Medical   Heart Disease Risk Reduction Clinical staff conducted group or individual video education with verbal and written material and guidebook.  Patient learns our heart is our most vital organ as it circulates oxygen, nutrients, white blood cells, and hormones throughout the entire body, and carries waste away. Data supports a plant-based eating plan like the Pritikin Program for its effectiveness in slowing progression of and reversing heart disease. The video provides a number of recommendations to address heart disease.   Metabolic Syndrome and Belly Fat  Clinical staff conducted group or individual video education with verbal and written material and guidebook.  Patient learns what metabolic syndrome is, how it leads to heart disease, and how one can reverse it and keep it from coming back. You have metabolic syndrome if you have 3 of the following 5 criteria: abdominal obesity, high blood pressure, high triglycerides, low HDL cholesterol, and high blood sugar.  Hypertension and Heart Disease Clinical staff conducted group or individual video education with verbal and written material and guidebook.  Patient learns that high  blood pressure, or hypertension, is very common in the [place]. Hypertension is largely due to excessive salt intake, but other important risk factors include being overweight, physical inactivity, drinking too much alcohol, smoking, and not eating enough potassium from fruits and vegetables. High blood pressure is a leading risk factor for heart attack, stroke, congestive heart failure, dementia, kidney failure, and premature death. Long-term effects of excessive salt intake include stiffening of the arteries and thickening of heart muscle and organ damage. Recommendations include ways to reduce hypertension and the risk of heart disease.  Diseases of Our Time - Focusing on Diabetes Clinical staff conducted group or individual video education with verbal and written material and guidebook.  Patient learns why the best way to stop diseases of our time is prevention, through food and other lifestyle changes. Medicine (such as prescription pills and surgeries) is often only a Band-Aid on the problem, not a long-term solution. Most common diseases of our time include obesity, type 2 diabetes, hypertension, heart disease, and cancer. The Pritikin Program is recommended and has  been proven to help reduce, reverse, and/or prevent the damaging effects of metabolic syndrome.  Nutrition   Overview of the Pritikin Eating Plan  Clinical staff conducted group or individual video education with verbal and written material and guidebook.  Patient learns about the [place] for disease risk reduction. The [place] emphasizes a wide variety of unrefined, minimally-processed carbohydrates, like fruits, vegetables, whole grains, and legumes. Go, Caution, and Stop food choices are explained. Plant-based and lean animal proteins are emphasized. Rationale provided for low sodium intake for blood pressure control, low added sugars for blood sugar stabilization, and low added fats and oils for  coronary artery disease risk reduction and weight management.  Calorie Density  Clinical staff conducted group or individual video education with verbal and written material and guidebook.  Patient learns about calorie density and how it impacts the Pritikin Eating Plan. Knowing the characteristics of the food you choose will help you decide whether those foods will lead to weight gain or weight loss, and whether you want to consume more or less of them. Weight loss is usually a side effect of the Pritikin Eating Plan because of its focus on low calorie-dense foods.  Label Reading  Clinical staff conducted group or individual video education with verbal and written material and guidebook.  Patient learns about the Pritikin recommended label reading guidelines and corresponding recommendations regarding calorie density, added sugars, sodium content, and whole grains.  Dining Out - Part 1  Clinical staff conducted group or individual video education with verbal and written material and guidebook.  Patient learns that restaurant meals can be sabotaging because they can be so high in calories, fat, sodium, and/or sugar. Patient learns recommended strategies on how to positively address this and avoid unhealthy pitfalls.  Facts on Fats  Clinical staff conducted group or individual video education with verbal and written material and guidebook.  Patient learns that lifestyle modifications can be just as effective, if not more so, as many medications for lowering your risk of heart disease. A Pritikin lifestyle can help to reduce your risk of inflammation and atherosclerosis (cholesterol build-up, or plaque, in the artery walls). Lifestyle interventions such as dietary choices and physical activity address the cause of atherosclerosis. A review of the types of fats and their impact on blood cholesterol levels, along with dietary recommendations to reduce fat intake is also included.  Nutrition Action Plan   Clinical staff conducted group or individual video education with verbal and written material and guidebook.  Patient learns how to incorporate Pritikin recommendations into their lifestyle. Recommendations include planning and keeping personal health goals in mind as an important part of their success.  Healthy Mind-Set    Healthy Minds, Bodies, Hearts  Clinical staff conducted group or individual video education with verbal and written material and guidebook.  Patient learns how to identify when they are stressed. Video will discuss the impact of that stress, as well as the many benefits of stress management. Patient will also be introduced to stress management techniques. The way we think, act, and feel has an impact on our hearts.  How Our Thoughts Can Heal Our Hearts  Clinical staff conducted group or individual video education with verbal and written material and guidebook.  Patient learns that negative thoughts can cause depression and anxiety. This can result in negative lifestyle behavior and serious health problems. Cognitive behavioral therapy is an effective method to help control our thoughts in order to change and improve our emotional  outlook.  Additional Videos:  Exercise    Improving Performance  Clinical staff conducted group or individual video education with verbal and written material and guidebook.  Patient learns to use a non-linear approach by alternating intensity levels and lengths of time spent exercising to help burn more calories and lose more body fat. Cardiovascular exercise helps improve heart health, metabolism, hormonal balance, blood sugar control, and recovery from fatigue. Resistance training improves strength, endurance, balance, coordination, reaction time, metabolism, and muscle mass. Flexibility exercise improves circulation, posture, and balance. Seek guidance from your physician and exercise physiologist before implementing an exercise routine and learn  your capabilities and proper form for all exercise.  Introduction to Yoga  Clinical staff conducted group or individual video education with verbal and written material and guidebook.  Patient learns about yoga, a discipline of the coming together of mind, breath, and body. The benefits of yoga include improved flexibility, improved range of motion, better posture and core strength, increased lung function, weight loss, and positive self-image. Yoga's heart health benefits include lowered blood pressure, healthier heart rate, decreased cholesterol and triglyceride levels, improved immune function, and reduced stress. Seek guidance from your physician and exercise physiologist before implementing an exercise routine and learn your capabilities and proper form for all exercise.  Medical   Aging: Enhancing Your Quality of Life  Clinical staff conducted group or individual video education with verbal and written material and guidebook.  Patient learns key strategies and recommendations to stay in good physical health and enhance quality of life, such as prevention strategies, having an advocate, securing a [place], and keeping a list of medications and system for tracking them. It also discusses how to avoid risk for bone loss.  Biology of Weight Control  Clinical staff conducted group or individual video education with verbal and written material and guidebook.  Patient learns that weight gain occurs because we consume more calories than we burn (eating more, moving less). Even if your body weight is normal, you may have higher ratios of fat compared to muscle mass. Too much body fat puts you at increased risk for cardiovascular disease, heart attack, stroke, type 2 diabetes, and obesity-related cancers. In addition to exercise, following the [place] can help reduce your risk.  Decoding Lab Results  Clinical staff conducted group or individual video education  with verbal and written material and guidebook.  Patient learns that lab test reflects one measurement whose values change over time and are influenced by many factors, including medication, stress, sleep, exercise, food, hydration, pre-existing medical conditions, and more. It is recommended to use the knowledge from this video to become more involved with your lab results and evaluate your numbers to speak with your doctor.   Diseases of Our Time - Overview  Clinical staff conducted group or individual video education with verbal and written material and guidebook.  Patient learns that according to the CDC, 50% to 70% of chronic diseases (such as obesity, type 2 diabetes, elevated lipids, hypertension, and heart disease) are avoidable through lifestyle improvements including healthier food choices, listening to satiety cues, and increased physical activity.  Sleep Disorders Clinical staff conducted group or individual video education with verbal and written material and guidebook.  Patient learns how good quality and duration of sleep are important to overall health and well-being. Patient also learns about sleep disorders and how they impact health along with recommendations to address them, including discussing with a physician.  Nutrition  Dining Out - Part 2 Clinical staff conducted group or individual video education with verbal and written material and guidebook.  Patient learns how to plan ahead and communicate in order to maximize their dining experience in a healthy and nutritious manner. Included are recommended food choices based on the type of restaurant the patient is visiting.   Fueling a Best boy conducted group or individual video education with verbal and written material and guidebook.  There is a strong connection between our food choices and our health. Diseases like obesity and type 2 diabetes are very prevalent and are in large-part due to lifestyle  choices. The Pritikin Eating Plan provides plenty of food and hunger-curbing satisfaction. It is easy to follow, affordable, and helps reduce health risks.  Menu Workshop  Clinical staff conducted group or individual video education with verbal and written material and guidebook.  Patient learns that restaurant meals can sabotage health goals because they are often packed with calories, fat, sodium, and sugar. Recommendations include strategies to plan ahead and to communicate with the manager, chef, or server to help order a healthier meal.  Planning Your Eating Strategy  Clinical staff conducted group or individual video education with verbal and written material and guidebook.  Patient learns about the [place] and its benefit of reducing the risk of disease. The [place] does not focus on calories. Instead, it emphasizes high-quality, nutrient-rich foods. By knowing the characteristics of the foods, we choose, we can determine their calorie density and make informed decisions.  Targeting Your Nutrition Priorities  Clinical staff conducted group or individual video education with verbal and written material and guidebook.  Patient learns that lifestyle habits have a tremendous impact on disease risk and progression. This video provides eating and physical activity recommendations based on your personal health goals, such as reducing LDL cholesterol, losing weight, preventing or controlling type 2 diabetes, and reducing high blood pressure.  Vitamins and Minerals  Clinical staff conducted group or individual video education with verbal and written material and guidebook.  Patient learns different ways to obtain key vitamins and minerals, including through a recommended healthy diet. It is important to discuss all supplements you take with your doctor.   Healthy Mind-Set    Smoking Cessation  Clinical staff conducted group or individual video education with verbal and  written material and guidebook.  Patient learns that cigarette smoking and tobacco addiction pose a serious health risk which affects millions of people. Stopping smoking will significantly reduce the risk of heart disease, lung disease, and many forms of cancer. Recommended strategies for quitting are covered, including working with your doctor to develop a successful plan.  Culinary   Becoming a Financial trader conducted group or individual video education with verbal and written material and guidebook.  Patient learns that cooking at home can be healthy, cost-effective, quick, and puts them in control. Keys to cooking healthy recipes will include looking at your recipe, assessing your equipment needs, planning ahead, making it simple, choosing cost-effective seasonal ingredients, and limiting the use of added fats, salts, and sugars.  Cooking - Breakfast and Snacks  Clinical staff conducted group or individual video education with verbal and written material and guidebook.  Patient learns how important breakfast is to satiety and nutrition through the entire day. Recommendations include key foods to eat during breakfast to help stabilize blood sugar levels and to prevent overeating at meals later in the day. Planning ahead is  also a key component.  Cooking - Human resources officer conducted group or individual video education with verbal and written material and guidebook.  Patient learns eating strategies to improve overall health, including an approach to cook more at home. Recommendations include thinking of animal protein as a side on your plate rather than center stage and focusing instead on lower calorie dense options like vegetables, fruits, whole grains, and plant-based proteins, such as beans. Making sauces in large quantities to freeze for later and leaving the skin on your vegetables are also recommended to maximize your experience.  Cooking - Healthy Salads and  Dressing Clinical staff conducted group or individual video education with verbal and written material and guidebook.  Patient learns that vegetables, fruits, whole grains, and legumes are the foundations of the [place]. Recommendations include how to incorporate each of these in flavorful and healthy salads, and how to create homemade salad dressings. Proper handling of ingredients is also covered. Cooking - Soups and [employer] - Soups and Desserts Clinical staff conducted group or individual video education with verbal and written material and guidebook.  Patient learns that Pritikin soups and desserts make for easy, nutritious, and delicious snacks and meal components that are low in sodium, fat, sugar, and calorie density, while high in vitamins, minerals, and filling fiber. Recommendations include simple and healthy ideas for soups and desserts.   Overview     The Pritikin Solution Program Overview Clinical staff conducted group or individual video education with verbal and written material and guidebook.  Patient learns that the results of the [place] Program have been documented in more than 100 articles published in peer-reviewed journals, and the benefits include reducing risk factors for (and, in some cases, even reversing) high cholesterol, high blood pressure, type 2 diabetes, obesity, and more! An overview of the three key pillars of the Pritikin Program will be covered: eating well, doing regular exercise, and having a healthy mind-set.  WORKSHOPS  Exercise: Exercise Basics: Building Your Action Plan Clinical staff led group instruction and group discussion with PowerPoint presentation and patient guidebook. To enhance the learning environment the use of posters, models and videos may be added. At the conclusion of this workshop, patients will comprehend the difference between physical activity and exercise, as well as the benefits of incorporating both, into  their routine. Patients will understand the FITT (Frequency, Intensity, Time, and Type) principle and how to use it to build an exercise action plan. In addition, safety concerns and other considerations for exercise and cardiac rehab will be addressed by the presenter. The purpose of this lesson is to promote a comprehensive and effective weekly exercise routine in order to improve patients' overall level of fitness.   Managing Heart Disease: Your Path to a Healthier Heart Clinical staff led group instruction and group discussion with PowerPoint presentation and patient guidebook. To enhance the learning environment the use of posters, models and videos may be added.At the conclusion of this workshop, patients will understand the anatomy and physiology of the heart. Additionally, they will understand how Pritikin's three pillars impact the risk factors, the progression, and the management of heart disease.  The purpose of this lesson is to provide a high-level overview of the heart, heart disease, and how the Pritikin lifestyle positively impacts risk factors.  Exercise Biomechanics Clinical staff led group instruction and group discussion with PowerPoint presentation and patient guidebook. To enhance the learning environment the use of posters, models and videos  may be added. Patients will learn how the structural parts of their bodies function and how these functions impact their daily activities, movement, and exercise. Patients will learn how to promote a neutral spine, learn how to manage pain, and identify ways to improve their physical movement in order to promote healthy living. The purpose of this lesson is to expose patients to common physical limitations that impact physical activity. Participants will learn practical ways to adapt and manage aches and pains, and to minimize their effect on regular exercise. Patients will learn how to maintain good posture while sitting, walking, and  lifting.  Balance Training and Fall Prevention  Clinical staff led group instruction and group discussion with PowerPoint presentation and patient guidebook. To enhance the learning environment the use of posters, models and videos may be added. At the conclusion of this workshop, patients will understand the importance of their sensorimotor skills (vision, proprioception, and the vestibular system) in maintaining their ability to balance as they age. Patients will apply a variety of balancing exercises that are appropriate for their current level of function. Patients will understand the common causes for poor balance, possible solutions to these problems, and ways to modify their physical environment in order to minimize their fall risk. The purpose of this lesson is to teach patients about the importance of maintaining balance as they age and ways to minimize their risk of falling.  WORKSHOPS   Nutrition:  Fueling a Scientist, research (physical sciences) led group instruction and group discussion with PowerPoint presentation and patient guidebook. To enhance the learning environment the use of posters, models and videos may be added. Patients will review the foundational principles of the [place] and understand what constitutes a serving size in each of the food groups. Patients will also learn Pritikin-friendly foods that are better choices when away from home and review make-ahead meal and snack options. Calorie density will be reviewed and applied to three nutrition priorities: weight maintenance, weight loss, and weight gain. The purpose of this lesson is to reinforce (in a group setting) the key concepts around what patients are recommended to eat and how to apply these guidelines when away from home by planning and selecting Pritikin-friendly options. Patients will understand how calorie density may be adjusted for different weight management goals.  Mindful Eating  Clinical staff led  group instruction and group discussion with PowerPoint presentation and patient guidebook. To enhance the learning environment the use of posters, models and videos may be added. Patients will briefly review the concepts of the [place] and the importance of low-calorie dense foods. The concept of mindful eating will be introduced as well as the importance of paying attention to internal hunger signals. Triggers for non-hunger eating and techniques for dealing with triggers will be explored. The purpose of this lesson is to provide patients with the opportunity to review the basic principles of the [place], discuss the value of eating mindfully and how to measure internal cues of hunger and fullness using the Hunger Scale. Patients will also discuss reasons for non-hunger eating and learn strategies to use for controlling emotional eating.  Targeting Your Nutrition Priorities Clinical staff led group instruction and group discussion with PowerPoint presentation and patient guidebook. To enhance the learning environment the use of posters, models and videos may be added. Patients will learn how to determine their genetic susceptibility to disease by reviewing their family history. Patients will gain insight into the importance of diet as part  of an overall healthy lifestyle in mitigating the impact of genetics and other environmental insults. The purpose of this lesson is to provide patients with the opportunity to assess their personal nutrition priorities by looking at their family history, their own health history and current risk factors. Patients will also be able to discuss ways of prioritizing and modifying the [place] for their highest risk areas  Menu  Clinical staff led group instruction and group discussion with PowerPoint presentation and patient guidebook. To enhance the learning environment the use of posters, models and videos may be added. Using menus  brought in from [employer], or printed from [employer], patients will apply the [place] dining out guidelines that were presented in the [employer] video. Patients will also be able to practice these guidelines in a variety of provided scenarios. The purpose of this lesson is to provide patients with the opportunity to practice hands-on learning of the [place] with actual menus and practice scenarios.  Label Reading Clinical staff led group instruction and group discussion with PowerPoint presentation and patient guidebook. To enhance the learning environment the use of posters, models and videos may be added. Patients will review and discuss the Pritikin label reading guidelines presented in Pritikin's Label Reading Educational series video. Using fool labels brought in from local grocery stores and markets, patients will apply the label reading guidelines and determine if the packaged food meet the Pritikin guidelines. The purpose of this lesson is to provide patients with the opportunity to review, discuss, and practice hands-on learning of the Pritikin Label Reading guidelines with actual packaged food labels. [place] Workshops are designed to teach patients ways to prepare quick, simple, and affordable recipes at home. The importance of nutrition's role in chronic disease risk reduction is reflected in its emphasis in the overall Pritikin program. By learning how to prepare essential core Pritikin Eating Plan recipes, patients will increase control over what they eat; be able to customize the flavor of foods without the use of added salt, sugar, or fat; and improve the quality of the food they consume. By learning a set of core recipes which are easily assembled, quickly prepared, and affordable, patients are more likely to prepare more healthy foods at home. These workshops focus on convenient breakfasts, simple  entres, side dishes, and desserts which can be prepared with minimal effort and are consistent with nutrition recommendations for cardiovascular risk reduction. Cooking [employer] are taught by a Engineer, materials (RD) who has been trained by the [employer]. The chef or RD has a clear understanding of the importance of minimizing - if not completely eliminating - added fat, sugar, and sodium in recipes. Throughout the series of [place] Workshop sessions, patients will learn about healthy ingredients and efficient methods of cooking to build confidence in their capability to prepare    Cooking School weekly topics:  Adding Flavor- Sodium-Free  Fast and Healthy Breakfasts  Powerhouse Plant-Based Proteins  Satisfying Salads and Dressings  Simple Sides and Sauces  International Cuisine-Spotlight on the [place] Zones  Delicious Desserts  Savory Soups  Efficiency Cooking - Meals in a Snap  Tasty Appetizers and Snacks  Comforting Weekend Breakfasts  One-Pot Wonders   Fast Evening Meals  Easy Flippin (Psychosocial): New Thoughts, New Behaviors Clinical staff led group instruction and group discussion with PowerPoint presentation and patient  guidebook. To enhance the learning environment the use of posters, models and videos may be added. Patients will learn and practice techniques for developing effective health and lifestyle goals. Patients will be able to effectively apply the goal setting process learned to develop at least one new personal goal.  The purpose of this lesson is to expose patients to a new skill set of behavior modification techniques such as techniques setting SMART goals, overcoming barriers, and achieving new thoughts and new behaviors.  Managing Moods and Relationships Clinical staff led group instruction and group discussion with PowerPoint presentation and patient  guidebook. To enhance the learning environment the use of posters, models and videos may be added. Patients will learn how emotional and chronic stress factors can impact their health and relationships. They will learn healthy ways to manage their moods and utilize positive coping mechanisms. In addition, ICR patients will learn ways to improve communication skills. The purpose of this lesson is to expose patients to ways of understanding how one's mood and health are intimately connected. Developing a healthy outlook can help build positive relationships and connections with others. Patients will understand the importance of utilizing effective communication skills that include actively listening and being heard. They will learn and understand the importance of the "4 Cs" and especially Connections in fostering of a Healthy Mind-Set.  Healthy Sleep for a Healthy Heart Clinical staff led group instruction and group discussion with PowerPoint presentation and patient guidebook. To enhance the learning environment the use of posters, models and videos may be added. At the conclusion of this workshop, patients will be able to demonstrate knowledge of the importance of sleep to overall health, well-being, and quality of life. They will understand the symptoms of, and treatments for, common sleep disorders. Patients will also be able to identify daytime and nighttime behaviors which impact sleep, and they will be able to apply these tools to help manage sleep-related challenges. The purpose of this lesson is to provide patients with a general overview of sleep and outline the importance of quality sleep. Patients will learn about a few of the most common sleep disorders. Patients will also be introduced to the concept of "sleep hygiene," and discover ways to self-manage certain sleeping problems through simple daily behavior changes. Finally, the workshop will motivate patients by clarifying the links between quality  sleep and their goals of heart-healthy living.   Recognizing and Reducing Stress Clinical staff led group instruction and group discussion with PowerPoint presentation and patient guidebook. To enhance the learning environment the use of posters, models and videos may be added. At the conclusion of this workshop, patients will be able to understand the types of stress reactions, differentiate between acute and chronic stress, and recognize the impact that chronic stress has on their health. They will also be able to apply different coping mechanisms, such as reframing negative self-talk. Patients will have the opportunity to practice a variety of stress management techniques, such as deep abdominal breathing, progressive muscle relaxation, and/or guided imagery.  The purpose of this lesson is to educate patients on the role of stress in their lives and to provide healthy techniques for coping with it.  Learning Barriers/Preferences:  Learning Barriers/Preferences - 10/07/22 1224       Learning Barriers/Preferences   Learning Barriers Sight;Hearing   wears glasses, in process of getting hearing aids   Learning Preferences Audio;Computer/Internet;Individual Instruction;Group Instruction;Pictoral;Skilled Demonstration;Verbal Instruction;Video;Written Material             Education Topics:  Knowledge Questionnaire Score:  Knowledge Questionnaire Score - 10/07/22 1226       Knowledge Questionnaire Score   Pre Score 19/24             Core Components/Risk Factors/Patient Goals at Admission:  Personal Goals and Risk Factors at Admission - 10/07/22 1226       Core Components/Risk Factors/Patient Goals on Admission   Hypertension Yes    Intervention Provide education on lifestyle modifcations including regular physical activity/exercise, weight management, moderate sodium restriction and increased consumption of fresh fruit, vegetables, and low fat dairy, alcohol moderation, and smoking  cessation.;Monitor prescription use compliance.    Expected Outcomes Short Term: Continued assessment and intervention until BP is < 140/63m HG in hypertensive participants. < 130/844mHG in hypertensive participants with diabetes, heart failure or chronic kidney disease.;Long Term: Maintenance of blood pressure at goal levels.    Lipids Yes    Intervention Provide education and support for participant on nutrition & aerobic/resistive exercise along with prescribed medications to achieve LDL '70mg'$ , HDL >'40mg'$ .    Expected Outcomes Short Term: Participant states understanding of desired cholesterol values and is compliant with medications prescribed. Participant is following exercise prescription and nutrition guidelines.;Long Term: Cholesterol controlled with medications as prescribed, with individualized exercise RX and with personalized nutrition plan. Value goals: LDL < '70mg'$ , HDL > 40 mg.             Core Components/Risk Factors/Patient Goals Review:   Goals and Risk Factor Review     Row Name 10/15/22 1647 10/20/22 1327 11/16/22 1039         Core Components/Risk Factors/Patient Goals Review   Personal Goals Review Hypertension;Lipids Hypertension;Lipids Hypertension;Lipids     Review Malory started intensive cardiac rehab on 10/15/22 and did well with exercise DiTremaynes off to a good start to exercise for her fitness level at  intensive cardiac rehab. DiKrystyneas chronic athritis pain. DiHortencias doing well with exercise at intensive cardiac rehab. [name] has lost 1.5 kg since starting the program. Will contiunue to monitor as [name] was lightheaded yesterday resolved with H20. Will continue to monitor BP     Expected Outcomes Luceal will continue to participate in intensive cardiac rehab for exercise, nutrition and lifestyle modifications DiSandyill continue to participate in intensive cardiac rehab for exercise, nutrition and lifestyle modifications Shanisha will continue to participate in  intensive cardiac rehab for exercise, nutrition and lifestyle modifications              Core Components/Risk Factors/Patient Goals at Discharge (Final Review):   Goals and Risk Factor Review - 11/16/22 1039       Core Components/Risk Factors/Patient Goals Review   Personal Goals Review Hypertension;Lipids    Review DiMarileas doing well with exercise at intensive cardiac rehab. [name] has lost 1.5 kg since starting the program. Will contiunue to monitor as [name] was lightheaded yesterday resolved with H20. 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 Introduction to pritikin education program/ intensive cardiac rehab. Initial orientation packet reviewed with patient. 30 Day ITP Review. Annalisse started 30 Day ITP Review. DiYaritzels off to a good start to exercise  at intensive cardiac rehab 30 Day ITP Review. Raquita has good attendance and participation in  exercise at intensive cardiac rehab             Comments: See ITP comments.

## 2022-11-17 ENCOUNTER — Encounter (HOSPITAL_COMMUNITY)
Admission: RE | Admit: 2022-11-17 | Discharge: 2022-11-17 | Disposition: A | Discharge status: Home / Self Care | Payer: Medicare Other | Source: Ambulatory Visit | Attending: Cardiovascular Disease | Admitting: Cardiovascular Disease

## 2022-11-17 DIAGNOSIS — Z48812 Encounter for surgical aftercare following surgery on the circulatory system: Secondary | ICD-10-CM | POA: Diagnosis not present

## 2022-11-17 DIAGNOSIS — Z955 Presence of coronary angioplasty implant and graft: Secondary | ICD-10-CM | POA: Diagnosis not present

## 2022-11-18 ENCOUNTER — Encounter (HOSPITAL_COMMUNITY)
Admission: RE | Admit: 2022-11-18 | Discharge: 2022-11-18 | Disposition: A | Discharge status: Home / Self Care | Payer: Medicare Other | Source: Ambulatory Visit | Attending: Cardiovascular Disease | Admitting: Cardiovascular Disease

## 2022-11-18 DIAGNOSIS — Z48812 Encounter for surgical aftercare following surgery on the circulatory system: Secondary | ICD-10-CM | POA: Diagnosis not present

## 2022-11-18 DIAGNOSIS — Z955 Presence of coronary angioplasty implant and graft: Secondary | ICD-10-CM | POA: Diagnosis not present

## 2022-11-19 ENCOUNTER — Encounter (HOSPITAL_COMMUNITY): Payer: Medicare Other

## 2022-11-22 ENCOUNTER — Encounter (HOSPITAL_COMMUNITY)
Admission: RE | Admit: 2022-11-22 | Discharge: 2022-11-22 | Disposition: A | Discharge status: Home / Self Care | Payer: Medicare Other | Source: Ambulatory Visit | Attending: Cardiovascular Disease | Admitting: Cardiovascular Disease

## 2022-11-22 DIAGNOSIS — Z955 Presence of coronary angioplasty implant and graft: Secondary | ICD-10-CM | POA: Diagnosis not present

## 2022-11-22 DIAGNOSIS — Z48812 Encounter for surgical aftercare following surgery on the circulatory system: Secondary | ICD-10-CM | POA: Diagnosis not present

## 2022-11-24 ENCOUNTER — Encounter (HOSPITAL_COMMUNITY)
Admission: RE | Admit: 2022-11-24 | Discharge: 2022-11-24 | Disposition: A | Discharge status: Home / Self Care | Payer: Medicare Other | Source: Ambulatory Visit | Attending: Cardiovascular Disease | Admitting: Cardiovascular Disease

## 2022-11-24 DIAGNOSIS — Z955 Presence of coronary angioplasty implant and graft: Secondary | ICD-10-CM

## 2022-11-24 DIAGNOSIS — Z48812 Encounter for surgical aftercare following surgery on the circulatory system: Secondary | ICD-10-CM | POA: Diagnosis not present

## 2022-11-26 ENCOUNTER — Encounter (HOSPITAL_COMMUNITY)
Admission: RE | Admit: 2022-11-26 | Discharge: 2022-11-26 | Disposition: A | Discharge status: Home / Self Care | Payer: Medicare Other | Source: Ambulatory Visit | Attending: Cardiovascular Disease | Admitting: Cardiovascular Disease

## 2022-11-26 DIAGNOSIS — Z48812 Encounter for surgical aftercare following surgery on the circulatory system: Secondary | ICD-10-CM | POA: Diagnosis not present

## 2022-11-26 DIAGNOSIS — Z955 Presence of coronary angioplasty implant and graft: Secondary | ICD-10-CM

## 2022-11-29 ENCOUNTER — Encounter (HOSPITAL_COMMUNITY)
Admission: RE | Admit: 2022-11-29 | Discharge: 2022-11-29 | Disposition: A | Discharge status: Home / Self Care | Payer: Medicare Other | Source: Ambulatory Visit | Attending: Cardiovascular Disease | Admitting: Cardiovascular Disease

## 2022-11-29 DIAGNOSIS — Z955 Presence of coronary angioplasty implant and graft: Secondary | ICD-10-CM | POA: Diagnosis not present

## 2022-11-29 DIAGNOSIS — Z48812 Encounter for surgical aftercare following surgery on the circulatory system: Secondary | ICD-10-CM | POA: Diagnosis not present

## 2022-12-01 ENCOUNTER — Encounter (HOSPITAL_COMMUNITY)
Admission: RE | Admit: 2022-12-01 | Discharge: 2022-12-01 | Disposition: A | Discharge status: Home / Self Care | Payer: Medicare Other | Source: Ambulatory Visit | Attending: Cardiovascular Disease | Admitting: Cardiovascular Disease

## 2022-12-01 DIAGNOSIS — Z955 Presence of coronary angioplasty implant and graft: Secondary | ICD-10-CM

## 2022-12-01 DIAGNOSIS — Z48812 Encounter for surgical aftercare following surgery on the circulatory system: Secondary | ICD-10-CM | POA: Diagnosis not present

## 2022-12-03 ENCOUNTER — Encounter (HOSPITAL_COMMUNITY)
Admission: RE | Admit: 2022-12-03 | Discharge: 2022-12-03 | Disposition: A | Discharge status: Home / Self Care | Payer: Medicare Other | Source: Ambulatory Visit | Attending: Cardiovascular Disease | Admitting: Cardiovascular Disease

## 2022-12-03 DIAGNOSIS — Z48812 Encounter for surgical aftercare following surgery on the circulatory system: Secondary | ICD-10-CM | POA: Diagnosis not present

## 2022-12-03 DIAGNOSIS — Z955 Presence of coronary angioplasty implant and graft: Secondary | ICD-10-CM | POA: Diagnosis not present

## 2022-12-03 DIAGNOSIS — K5900 Constipation, unspecified: Secondary | ICD-10-CM | POA: Diagnosis not present

## 2022-12-06 ENCOUNTER — Encounter (HOSPITAL_COMMUNITY)
Admission: RE | Admit: 2022-12-06 | Discharge: 2022-12-06 | Disposition: A | Discharge status: Home / Self Care | Payer: Medicare Other | Source: Ambulatory Visit | Attending: Cardiovascular Disease | Admitting: Cardiovascular Disease

## 2022-12-06 DIAGNOSIS — Z48812 Encounter for surgical aftercare following surgery on the circulatory system: Secondary | ICD-10-CM | POA: Diagnosis not present

## 2022-12-06 DIAGNOSIS — Z955 Presence of coronary angioplasty implant and graft: Secondary | ICD-10-CM | POA: Diagnosis not present

## 2022-12-08 ENCOUNTER — Encounter (HOSPITAL_COMMUNITY)
Admission: RE | Admit: 2022-12-08 | Discharge: 2022-12-08 | Disposition: A | Discharge status: Home / Self Care | Payer: Medicare Other | Source: Ambulatory Visit | Attending: Cardiovascular Disease | Admitting: Cardiovascular Disease

## 2022-12-08 DIAGNOSIS — Z955 Presence of coronary angioplasty implant and graft: Secondary | ICD-10-CM

## 2022-12-08 DIAGNOSIS — Z48812 Encounter for surgical aftercare following surgery on the circulatory system: Secondary | ICD-10-CM | POA: Diagnosis not present

## 2022-12-08 NOTE — Progress Notes (Signed)
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 Swallow whole., Disp: 90 tablet, Rfl: 1   atorvastatin (LIPITOR) 40 MG tablet, Take 1 tablet (40 mg total) by mouth daily., Disp: 90 tablet, Rfl: 3   azelastine (OPTIVAR) 0.05 % ophthalmic solution, Place 1 drop into both eyes daily as needed (allergies)., Disp: , Rfl:    betamethasone dipropionate 0.05 % cream, Apply 1 Application topically daily as needed (rash)., Disp: , Rfl:    cephALEXin (KEFLEX) 250 MG capsule, Take 250 mg by mouth 3 (three) times daily., Disp: , Rfl:    clopidogrel (PLAVIX) 75 MG tablet, Take 1 tablet (75 mg total) by mouth daily., Disp: 90 tablet, Rfl: 3   metoprolol tartrate (LOPRESSOR) 25 MG tablet, Take 1 tablet (25 mg total) by mouth 2 (two) times daily., Disp: 180 tablet, Rfl: 3   Multiple Vitamin (MULTIVITAMIN) capsule, Take 1 capsule by mouth daily., Disp: , Rfl:    nitroGLYCERIN (NITROSTAT) 0.4 MG SL tablet, Place 1 tablet (0.4 mg total) under the tongue every 5 (five) minutes as needed for chest pain. (Patient not taking: Reported on 08/27/2022), Disp: 25 tablet, Rfl: 3   omeprazole (PRILOSEC) 20 MG capsule, Take 20 mg by mouth every morning., Disp: , Rfl:     Vitamin D, Ergocalciferol, (DRISDOL) 1.25 MG (50000 UNIT) CAPS capsule, Take 50,000 Units by mouth 3 (three) times a week., Disp: , Rfl:   Past Medical History: Past Medical History:  Diagnosis Date   Arthritis    Frequency of urination    GERD (gastroesophageal reflux disease)    no meds currently   Nocturia    Psoriasis     Tobacco Use: Social History   Tobacco Use  Smoking Status Former   Types: Cigarettes   Quit date: 04/10/2000   Years since quitting: 22.6  Smokeless Tobacco Never    Labs: Review Flowsheet       Latest Ref Rng & Units 03/18/2022 05/06/2022 10/11/2022  Labs for ITP Cardiac and Pulmonary Rehab  Cholestrol 100 - 199 mg/dL 185  - 126   LDL (calc) 0 - 99 mg/dL 111  - 56   HDL-C >39 mg/dL 55  - 54   Trlycerides 0 - 149 mg/dL 106  - 85   TCO2 22 - 32 mmol/L - 21  -    Capillary Blood Glucose: No results found for: "GLUCAP"   Exercise Target Goals: Exercise Program Goal: Individual exercise prescription set using results from initial 6 min walk test and THRR while considering  patient's activity barriers and safety.   Exercise Prescription Goal: Initial exercise prescription builds to 30-45 minutes a day of aerobic activity, 2-3 days per week.  Home exercise guidelines  will be given to patient during program as part of exercise prescription that the participant will acknowledge.  Activity Barriers & Risk Stratification:  Activity Barriers & Cardiac Risk Stratification - 10/07/22 1219       Activity Barriers & Cardiac Risk Stratification   Activity Barriers Arthritis;Right Knee Replacement;Joint Problems;Shortness of Breath;Balance Concerns;History of Falls;Assistive Device;Deconditioning    Cardiac Risk Stratification High   <5 METs 6MWT            6 Minute Walk:  6 Minute Walk     Row Name 10/07/22 1218         6 Minute Walk   Phase Initial     Distance 979 feet     Walk Time 6 minutes     # of Rest Breaks 0     MPH 1.85      METS 1.56     RPE 11     Perceived Dyspnea  1     VO2 Peak 5.55     Symptoms No     Resting HR 76 bpm     Resting BP 118/68     Resting Oxygen Saturation  100 %     Exercise Oxygen Saturation  during 6 min walk 100 %     Max Ex. HR 94 bpm     Max Ex. BP 126/64     2 Minute Post BP 122/70              Oxygen Initial Assessment:   Oxygen Re-Evaluation:   Oxygen Discharge (Final Oxygen Re-Evaluation):   Initial Exercise Prescription:  Initial Exercise Prescription - 10/07/22 1200       Date of Initial Exercise RX and Referring Provider   Date 10/07/22    Referring Provider Eleonore Chiquito, MD    Expected Discharge Date 12/03/22      Recumbant Bike   Level 1    RPM 60    Minutes 15    METs 2      NuStep   Level 1    SPM 75    Minutes 15    METs 1.7      Prescription Details   Frequency (times per week) 3    Duration Progress to 30 minutes of continuous aerobic without signs/symptoms of physical distress      Intensity   THRR 40-80% of Max Heartrate 56-112    Ratings of Perceived Exertion 11-13    Perceived Dyspnea 0-4      Progression   Progression Continue to progress workloads to maintain intensity without signs/symptoms of physical distress.      Resistance Training   Training Prescription Yes    Weight 2    Reps 10-15             Perform Capillary Blood Glucose checks as needed.  Exercise Prescription Changes:   Exercise Prescription Changes     Row Name 10/13/22 1622 10/29/22 1636 11/15/22 1600 12/01/22 1700       Response to Exercise   Blood Pressure (Admit) 122/72 116/70 106/62 112/66    Blood Pressure (Exercise) 132/70 148/78 118/72 120/80    Blood Pressure (Exit) 91/57  H2O given, recheck 113/72 104/80  H2O given, recheck 113/72 96/59 120/72    Heart Rate (Admit) 71 bpm 70 bpm 64 bpm 66 bpm    Heart Rate (Exercise) 85 bpm 98 bpm 89 bpm 90 bpm    Heart Rate (Exit) 74 bpm 79 bpm 72 bpm 70 bpm  Rating of Perceived Exertion  (Exercise) 11 11 12  10.5    Perceived Dyspnea (Exercise) 0 0 0 --    Symptoms 0 0 none None    Comments Pt first day in the pritikin program Reviewed MET's, goals and home ExRx Reviewed METs with pt Reviewed MET's and goals    Duration Progress to 30 minutes of  aerobic without signs/symptoms of physical distress Progress to 30 minutes of  aerobic without signs/symptoms of physical distress Progress to 30 minutes of  aerobic without signs/symptoms of physical distress Progress to 30 minutes of  aerobic without signs/symptoms of physical distress    Intensity THRR unchanged THRR unchanged THRR unchanged THRR unchanged      Progression   Progression Continue to progress workloads to maintain intensity without signs/symptoms of physical distress. Continue to progress workloads to maintain intensity without signs/symptoms of physical distress. Continue to progress workloads to maintain intensity without signs/symptoms of physical distress. Continue to progress workloads to maintain intensity without signs/symptoms of physical distress.    Average METs 1.85 1.95 2.05 2.3      Resistance Training   Training Prescription Yes Yes Yes No    Weight 2 2 2  --    Reps 10-15 10-15 10-15 --    Time 10 Minutes 10 Minutes 10 Minutes --      Interval Training   Interval Training -- -- No --      Recumbant Bike   Level 1 1 2 2     RPM -- 60 -- 60    Watts -- 16 -- 32    Minutes 15 15 15 15     METs 1.7 1.7 2 2.3      NuStep   Level 1 1 1 2     SPM 69 73 -- 89    Minutes 15 15 15 15     METs 2 2.2 2.2 2.3      Home Exercise Plan   Plans to continue exercise at -- Home (comment) -- --    Frequency -- Add 3 additional days to program exercise sessions. -- --    Initial Home Exercises Provided -- 10/29/22 -- --             Exercise Comments:   Exercise Comments     Row Name 10/13/22 1630 10/29/22 1643 11/15/22 1651 12/01/22 1717     Exercise Comments Pt first day in the Pritikin ICR program.  Pt tolerated exercise well with an average MET level of 1.85. Pt is learning her THRR, RPE and ExRx. But pt felt good about her first day.  Will continue to monitor pt and progress workloads as tolerated without sign or symptom Reviewed MET's, goals and home ExRx. Pt tolerated exercise well with an average MET level of 1.95. Pt is making slow progressions and talked more about increasing SPM and RPM to help achieve greater MET levels. Pt feels good about her goals so far and feels she is gaining strength, stamina and becoming more independent. Pt also states she feels her housework is becoming easier. Pt will exercise on her own 3-4 days by walking for 30-45 mins per session, also gave pt weight and stretches packet to use on her own. Reviewed METs with pt today, pt is exercising at an average MET level of 2.05 and tolerating it well. Pt increased her workload on the recumbant bike today and is excited about her progress. Pt states she gets frustrated by pain that limits her, but is eager to continue to  make progress in program. Reviewed MET's and goals. Pt tolerated exercise well with an average MET level of 2.3. Pt is progressing well and feels good about her exercise. She is increacing strength, stamina and her independance. Pt feels good about her goals so far and has a very positive attitude             Exercise Goals and Review:   Exercise Goals     Row Name 10/07/22 1222             Exercise Goals   Increase Physical Activity Yes       Intervention Provide advice, education, support and counseling about physical activity/exercise needs.;Develop an individualized exercise prescription for aerobic and resistive training based on initial evaluation findings, risk stratification, comorbidities and participant's personal goals.       Expected Outcomes Short Term: Attend rehab on a regular basis to increase amount of physical activity.;Long Term: Exercising regularly at least 3-5 days a  week.;Long Term: Add in home exercise to make exercise part of routine and to increase amount of physical activity.       Increase Strength and Stamina Yes       Intervention Provide advice, education, support and counseling about physical activity/exercise needs.;Develop an individualized exercise prescription for aerobic and resistive training based on initial evaluation findings, risk stratification, comorbidities and participant's personal goals.       Expected Outcomes Short Term: Increase workloads from initial exercise prescription for resistance, speed, and METs.;Short Term: Perform resistance training exercises routinely during rehab and add in resistance training at home;Long Term: Improve cardiorespiratory fitness, muscular endurance and strength as measured by increased METs and functional capacity (6MWT)       Able to understand and use rate of perceived exertion (RPE) scale Yes       Intervention Provide education and explanation on how to use RPE scale       Expected Outcomes Short Term: Able to use RPE daily in rehab to express subjective intensity level;Long Term:  Able to use RPE to guide intensity level when exercising independently       Knowledge and understanding of Target Heart Rate Range (THRR) Yes       Intervention Provide education and explanation of THRR including how the numbers were predicted and where they are located for reference       Expected Outcomes Short Term: Able to state/look up THRR;Short Term: Able to use daily as guideline for intensity in rehab;Long Term: Able to use THRR to govern intensity when exercising independently       Understanding of Exercise Prescription Yes       Intervention Provide education, explanation, and written materials on patient's individual exercise prescription       Expected Outcomes Short Term: Able to explain program exercise prescription;Long Term: Able to explain home exercise prescription to exercise independently                 Exercise Goals Re-Evaluation :  Exercise Goals Re-Evaluation     Row Name 10/13/22 1625 10/29/22 1639 12/01/22 1715         Exercise Goal Re-Evaluation   Exercise Goals Review Increase Physical Activity;Understanding of Exercise Prescription;Increase Strength and Stamina;Knowledge and understanding of Target Heart Rate Range (THRR);Able to understand and use rate of perceived exertion (RPE) scale Increase Physical Activity;Understanding of Exercise Prescription;Increase Strength and Stamina;Knowledge and understanding of Target Heart Rate Range (THRR);Able to understand and use rate of perceived exertion (RPE) scale Increase  Physical Activity;Understanding of Exercise Prescription;Increase Strength and Stamina;Knowledge and understanding of Target Heart Rate Range (THRR);Able to understand and use rate of perceived exertion (RPE) scale     Comments Pt first day in the Pritikin ICR program. Pt tolerated exercise well with an average MET level of 1.85. Pt is learning her THRR, RPE and ExRx. But pt felt good about her first day. Reviewed MET's, goals and home ExRx. Pt tolerated exercise well with an average MET level of 1.95. Pt is making slow progressions and talked more about increasing SPM and RPM to help achieve greater MET levels. Pt feels good about her goals so far and feels she is gaining strength, stamina and becoming more independent. Pt also states she feels her housework is becoming easier. Pt will exercise on her own 3-4 days by walking for 30-45 mins per session, also gave pt weight and stretches packet to use on her own. Reviewed MET's and goals. Pt tolerated exercise well with an average MET level of 2.3. Pt is progressing well and feels good about her exercise. She is increacing strength, stamina and her independance. Pt feels good about her goals so far and has a very positive attitude     Expected Outcomes Will continue to monitor pt and progress workloads as tolerated without  sign or symptom Will continue to monitor pt and progress workloads as tolerated without sign or symptom Will continue to monitor pt and progress workloads as tolerated without sign or symptom              Discharge Exercise Prescription (Final Exercise Prescription Changes):  Exercise Prescription Changes - 12/01/22 1700       Response to Exercise   Blood Pressure (Admit) 112/66    Blood Pressure (Exercise) 120/80    Blood Pressure (Exit) 120/72    Heart Rate (Admit) 66 bpm    Heart Rate (Exercise) 90 bpm    Heart Rate (Exit) 70 bpm    Rating of Perceived Exertion (Exercise) 10.5    Symptoms None    Comments Reviewed MET's and goals    Duration Progress to 30 minutes of  aerobic without signs/symptoms of physical distress    Intensity THRR unchanged      Progression   Progression Continue to progress workloads to maintain intensity without signs/symptoms of physical distress.    Average METs 2.3      Resistance Training   Training Prescription No      Recumbant Bike   Level 2    RPM 60    Watts 32    Minutes 15    METs 2.3      NuStep   Level 2    SPM 89    Minutes 15    METs 2.3             Nutrition:  Target Goals: Understanding of nutrition guidelines, daily intake of sodium 1500mg , cholesterol 200mg , calories 30% from fat and 7% or less from saturated fats, daily to have 5 or more servings of fruits and vegetables.  Biometrics:  Pre Biometrics - 10/07/22 1215       Pre Biometrics   Waist Circumference 36.5 inches    Hip Circumference 44.75 inches    Waist to Hip Ratio 0.82 %    Triceps Skinfold 38 mm    % Body Fat 42.5 %    Grip Strength 22 kg    Flexibility 19.5 in    Single Leg Stand 10.43 seconds  Nutrition Therapy Plan and Nutrition Goals:  Nutrition Therapy & Goals - 12/06/22 1602       Nutrition Therapy   Diet Heart healthy diet    Drug/Food Interactions Statins/Certain Fruits      Personal Nutrition Goals    Nutrition Goal Patient to identify strategies for managing cardiovascular risk by attending the Pritikin education and nutrition series weekly    Personal Goal #2 Patient to improve diet quality by using the plate method as a daily guide for meal planning to include lean protien/plant protein, fruits, vegetables, whole grains, and nonfat/lowfat dairy as part of balanced diet.    Comments Goals in action. Tonica continues to attend the Pritikin education and nutrition series regularly. She has increased high fiber foods, reduce sodium, and continues moderation of saturated fat. Ladiamond will benefit from participation in intensive cardiac rehab for nutrition, exercise, and lifestyle modification.      Intervention Plan   Intervention Prescribe, educate and counsel regarding individualized specific dietary modifications aiming towards targeted core components such as weight, hypertension, lipid management, diabetes, heart failure and other comorbidities.;Nutrition handout(s) given to patient.    Expected Outcomes Short Term Goal: Understand basic principles of dietary content, such as calories, fat, sodium, cholesterol and nutrients.;Long Term Goal: Adherence to prescribed nutrition plan.             Nutrition Assessments:  Nutrition Assessments - 10/14/22 1146       Rate Your Plate Scores   Pre Score 61            MEDIFICTS Score Key: ?70 Need to make dietary changes  40-70 Heart Healthy Diet ? 40 Therapeutic Level Cholesterol Diet   Flowsheet Row INTENSIVE CARDIAC REHAB from 10/13/2022 in [facility] for Heart, Vascular, & Lung Health  Picture Your Plate Total Score on Admission 61      Picture Your Plate Scores: [id number] Unhealthy dietary pattern with much room for improvement. 41-50 Dietary pattern unlikely to meet recommendations for good health and room for improvement. 51-60 More healthful dietary pattern, with some room for improvement.  >60 Healthy  dietary pattern, although there may be some specific behaviors that could be improved.    Nutrition Goals Re-Evaluation:  Nutrition Goals Re-Evaluation     [place] Name 10/14/22 0905 11/12/22 1521 12/06/22 1602         Goals   Current Weight 173 lb 8 oz (78.7 kg) 172 lb 2.9 oz (78.1 kg) 170 lb 10.2 oz (77.4 kg)     Comment lipids WNL, potassium elevated 5.6- was rechecked yesterday and awaiting results/intervention. She does report chronic pain from arthiritis. lipids WNL, Potassium 5.4- monitored by cardiology No new labs; lipids WNL, Potassium 5.4- monitored by cardiology. She is down 3.3# since starting with our program.     Expected Outcome Malana will benefit from participation in intensive cardiac rehab for nutrition, exercise, and lifestyle modification. Goals in action. Bedie continues to attend the Pritikin education and nutrition series regularly. She is motivated to increase high fiber foods, reduce sodium, and continues moderation of saturated fat. Layia will benefit from participation in intensive cardiac rehab for nutrition, exercise, and lifestyle modification. Goals in action. [place] continues to attend the Pritikin education and nutrition series regularly. She has increased high fiber foods, reduce sodium, and continues moderation of saturated fat. Lyliah will benefit from participation in intensive cardiac rehab for nutrition, exercise, and lifestyle modification.  Nutrition Goals Re-Evaluation:  Nutrition Goals Re-Evaluation     [place] Name 10/14/22 0905 11/12/22 1521 12/06/22 1602         Goals   Current Weight 173 lb 8 oz (78.7 kg) 172 lb 2.9 oz (78.1 kg) 170 lb 10.2 oz (77.4 kg)     Comment lipids WNL, potassium elevated 5.6- was rechecked yesterday and awaiting results/intervention. She does report chronic pain from arthiritis. lipids WNL, Potassium 5.4- monitored by cardiology No new labs; lipids WNL, Potassium 5.4- monitored by cardiology. She is down  3.3# since starting with our program.     Expected Outcome Deneane will benefit from participation in intensive cardiac rehab for nutrition, exercise, and lifestyle modification. Goals in action. Jackqueline continues to attend the Pritikin education and nutrition series regularly. She is motivated to increase high fiber foods, reduce sodium, and continues moderation of saturated fat. Christel will benefit from participation in intensive cardiac rehab for nutrition, exercise, and lifestyle modification. Goals in action. Lillianah continues to attend the Pritikin education and nutrition series regularly. She has increased high fiber foods, reduce sodium, and continues moderation of saturated fat. Saeeda will benefit from participation in intensive cardiac rehab for nutrition, exercise, and lifestyle modification.              Nutrition Goals Discharge (Final Nutrition Goals Re-Evaluation):  Nutrition Goals Re-Evaluation - 12/06/22 1602       Goals   Current Weight 170 lb 10.2 oz (77.4 kg)    Comment No new labs; lipids WNL, Potassium 5.4- monitored by cardiology. She is down 3.3# since starting with our program.    Expected Outcome Goals in action. Jalayna continues to attend the Pritikin education and nutrition series regularly. She has increased high fiber foods, reduce sodium, and continues moderation of saturated fat. Kierston will benefit from participation in intensive cardiac rehab for nutrition, exercise, and lifestyle modification.             Psychosocial: Target Goals: Acknowledge presence or absence of significant depression and/or stress, maximize coping skills, provide positive support system. Participant is able to verbalize types and ability to use techniques and skills needed for reducing stress and depression.  Initial Review & Psychosocial Screening:  Initial Psych Review & Screening - 10/07/22 1016       Initial Review   Current issues with Current Sleep Concerns      Family  Dynamics   Good Support System? Yes   [name] has her son and daughter for support.   Comments Nakiyah recently moved in with her daughter, son in law, and grandchildren for health and safety reasons after a fall 5 years ago. She states that while this has been a big adjustment, she has tried to remain positive and has great communication with her daughter. She also enjoys the added time with her grandchildren who she adores. Ronica has had some diffculty with sleeping at night, and some fatigue during the day which she attritubes to her arthritis pain and says "that's just part of getting old". Macee is trying to have a positive outlook on the aging process, but says some days it can be tough to accept. [name] denies need for counseling or additional support at this time.      Barriers   Psychosocial barriers to participate in program There are no identifiable barriers or psychosocial needs.;The patient should benefit from training in stress management and relaxation.      Screening Interventions   Interventions Encouraged to exercise;To provide support  and resources with identified psychosocial needs;Provide feedback about the scores to participant    Expected Outcomes Short Term goal: Identification and review with participant of any Quality of Life or Depression concerns found by scoring the questionnaire.;Long Term goal: The participant improves quality of Life and PHQ9 Scores as seen by post scores and/or verbalization of changes             Quality of Life Scores:  Quality of Life - 10/07/22 1223       Quality of Life   Select Quality of Life      Quality of Life Scores   Health/Function Pre 17.36 %    Socioeconomic Pre 25.5 %    Psych/Spiritual Pre 23.79 %    Family Pre 11.63 %    GLOBAL Pre 19.89 %            Scores of 19 and below usually indicate a poorer quality of life in these areas.  A difference of  2-3 points is a clinically meaningful difference.  A difference of  2-3 points in the total score of the Quality of Life Index has been associated with significant improvement in overall quality of life, self-image, physical symptoms, and general health in studies assessing change in quality of life.  PHQ-9: Review Flowsheet       10/07/2022  Depression screen PHQ 2/9  Decreased Interest 0  Down, Depressed, Hopeless 1  PHQ - 2 Score 1  Altered sleeping 2  Tired, decreased energy 3  Change in appetite 1  Feeling bad or failure about yourself  0  Trouble concentrating 0  Moving slowly or fidgety/restless 1  Suicidal thoughts 0  PHQ-9 Score 8  Difficult doing work/chores Not difficult at all   Interpretation of Total Score  Total Score Depression Severity:  1-4 = Minimal depression, 5-9 = Mild depression, 10-14 = Moderate depression, 15-19 = Moderately severe depression, 20-27 = Severe depression   Psychosocial Evaluation and Intervention:   Psychosocial Re-Evaluation:  Psychosocial Re-Evaluation     [place] Name 10/15/22 1642 10/20/22 1324 11/16/22 1031 12/08/22 1800       Psychosocial Re-Evaluation   Current issues with Current Sleep Concerns Current Sleep Concerns Current Sleep Concerns Current Sleep Concerns    Comments Inara did not voice any increased concnerns or stressors on her first day of exercise. Will review QOL and PHQ-9 in the upcoming week Quality of life questionnare reviewed. Gerene denies being depressed cureently. Shamiah says that arthritis pain sometimes limits what she can do. [name] has not voiced any further concerns or stressors. Anilah says that arthritis pain sometimes limits what she can do. [name] continues  not  to voice any further concerns or stressors at intensive cardiac rehab    Expected Outcomes Aseneth will have decreased stressors/ concerns upon completion of intensive cardiac rehab Betina will have decreased stressors/ concerns upon completion of intensive cardiac rehab Sawyer will have decreased stressors/ concerns  upon completion of intensive cardiac rehab Ketrina will have decreased stressors/ concerns upon completion of intensive cardiac rehab    Interventions Stress management education;Encouraged to attend Cardiac Rehabilitation for the exercise Stress management education;Encouraged to attend Cardiac Rehabilitation for the exercise Stress management education;Encouraged to attend Cardiac Rehabilitation for the exercise Stress management education;Encouraged to attend Cardiac Rehabilitation for the exercise    Continue Psychosocial Services  Follow up required by staff No Follow up required No Follow up required No Follow up required  Psychosocial Discharge (Final Psychosocial Re-Evaluation):  Psychosocial Re-Evaluation - 12/08/22 1800       Psychosocial Re-Evaluation   Current issues with Current Sleep Concerns    Comments [name] continues  not  to voice any further concerns or stressors at intensive cardiac rehab    Expected [place] will have decreased stressors/ concerns upon completion of intensive cardiac rehab    Interventions Stress management education;Encouraged to attend Cardiac Rehabilitation for the exercise    Continue Psychosocial Services  No Follow up required             Vocational Rehabilitation: Provide vocational rehab assistance to qualifying candidates.   Vocational Rehab Evaluation & Intervention:  Vocational Rehab - 10/07/22 1226       Initial Vocational Rehab Evaluation & Intervention   Assessment shows need for Vocational Rehabilitation No   Jalya is retired and does not need vocational rehab at this time            Education: Education Goals: Education classes will be provided on a weekly basis, covering required topics. Participant will state understanding/return demonstration of topics presented.    Education     Row Name 10/13/22 1600     Education   Cardiac Education Topics [employer]   Educator Dietitian   Weekly Topic Tasty Appetizers and Snacks   Instruction Review Code 1- Verbalizes Understanding   Class Start Time 1402   Class Stop Time 1448   Class Time Calculation (min) 46 min    [place] Name 10/18/22 1500     Education   Cardiac Education Topics [place]   Environmental consultant Exercise   Exercise Workshop Exercise Basics: Building Your Action Plan   Instruction Review Code 1- Verbalizes Understanding   Class Start Time 1359   Class Stop Time 1445   Class Time Calculation (min) 46 min    [place] Name 10/20/22 1600     Education   Cardiac Education Topics Pritikin   Financial trader   Weekly Topic Efficiency Cooking - Meals in a Snap   Instruction Review Code 1- Verbalizes Understanding   Class Start Time 1355   Class Stop Time 1437   Class Time Calculation (min) 42 min    [place] Name 10/27/22 1600     Education   Cardiac Education Topics Prit[place]in   Financial trader   Weekly Topic One-Pot Wonders   Instruction Review Code 1- Verbalizes Understanding   Class Start Time 1400   Class Stop Time 1450   Class Time Calculation (min) 50 min    [place] Name 10/29/22 1600     Education   Cardiac Education Topics Pritikin   Architect Education   General Education Hypertension and Heart Disease   Instruction Review Code 1- Verbalizes Understanding   Class Start Time 1410   Class Stop Time 1450   Class Time Calculation (min) 40 min    [place] Name 11/01/22 1500     Education   Cardiac Education Topics Pritikin   Environmental consultant Psychosocial   Psychosocial Workshop Healthy Sleep for a Healthy  Heart   Instruction Review Code 1- Verbalizes Understanding   Class Start Time  1400   Class Stop Time 1450   Class Time Calculation (min) 50 min    [place] Name 11/08/22 1600     Education   Cardiac Education Topics Prit[place]in   Academic librarian Exercise Education   Exercise Education Biomechanial Limitations   Instruction Review Code 1- Verbalizes Understanding   Class Start Time 1406   Class Stop Time 1442   Class Time Calculation (min) 36 min    [place] Name 11/10/22 1600     Education   Cardiac Education Topics Pritikin   Financial trader   Weekly Topic Fast Evening Meals   Instruction Review Code 1- Verbalizes Understanding   Class Start Time 1400   Class Stop Time 1445   Class Time Calculation (min) 45 min    [place] Name 11/15/22 1500     Education   Cardiac Education Topics Pritikin   Financial trader   Weekly Topic International Cuisine- Spotlight on the [place] Zones   Instruction Review Code 1- Verbalizes Understanding   Class Start Time 1405   Class Stop Time 1453   Class Time Calculation (min) 48 min    [place] Name 11/17/22 1600     Education   Cardiac Education Topics Pritikin   Scientist, research (life sciences)   Educator Dietitian   Nutrition Facts on Fat   Instruction Review Code 1- Verbalizes Understanding   Class Start Time 1400   Class Stop Time 1450   Class Time Calculation (min) 50 min    [place] Name 11/22/22 1500     Education   Cardiac Education Topics Pritikin   Environmental consultant Psychosocial   Psychosocial Workshop Other  From Head to Heart   Instruction Review Code 1- Verbalizes Understanding   Class Start Time 1400   Class Stop Time 1452   Class Time Calculation (min) 52 min    [place] Name 11/24/22 1500     Education   Cardiac Education Topics Pritikin   Sports coach   Weekly Topic Adding Flavor - Sodium-Free   Instruction Review Code 1- Verbalizes Understanding   Class Start Time 1400   Class Stop Time 1453   Class Time Calculation (min) 53 min    [place] Name 11/26/22 1500     Education   Cardiac Education Topics Pritikin   Architect Education   General Education Heart Disease Risk Reduction   Instruction Review Code 1- Verbalizes Understanding   Class Start Time 1355   Class Stop Time 1440   Class Time Calculation (min) 45 min    [place] Name 11/29/22 1500     Education   Cardiac Education Topics Pritikin   [employer] Workshops     Workshops   Educator Exercise Physiologist   [employer] Exercise   Exercise Workshop Hotel manager and Fall Prevention   Instruction Review Code 1- Verbalizes Understanding   Class Start Time 1350   Class Stop Time 1432   Class Time Calculation (min)  74 min    [place] Name 12/01/22 1500     Education   Cardiac Education Topics Pritikin   Financial trader   Weekly Topic Fast and Healthy Breakfasts   Instruction Review Code 1- Verbalizes Understanding   Class Start Time 1355   Class Stop Time 1450   Class Time Calculation (min) 55 min    [place] Name 12/03/22 1600     Education   Cardiac Education Topics Pritikin   Lexicographer Nutrition   Nutrition Overview of the [place]   Instruction Review Code 1- Verbalizes Understanding   Class Start Time 1400   Class Stop Time 1436   Class Time Calculation (min) 36 min    [place] Name 12/06/22 1600     Education   Cardiac Education Topics Pritikin   [employer] Workshops     Workshops   Educator Exercise Physiologist   [facility] Psychosocial   Psychosocial Workshop Recognizing and Reducing Stress   Instruction Review Code 1- Verbalizes Understanding   Class Start Time 1405    Class Stop Time 1454   Class Time Calculation (min) 49 min    [place] Name 12/08/22 1600     Education   Cardiac Education Topics Pritikin   Financial trader   Weekly Topic Personalizing Your Pritikin Plate   Instruction Review Code 1- Verbalizes Understanding   Class Start Time 1400   Class Stop Time 1448   Class Time Calculation (min) 48 min            Core Videos: Exercise    Move It!  Clinical staff conducted group or individual video education with verbal and written material and guidebook.  Patient learns the recommended Pritikin exercise program. Exercise with the goal of living a long, healthy life. Some of the health benefits of exercise include controlled diabetes, healthier blood pressure levels, improved cholesterol levels, improved heart and lung capacity, improved sleep, and better body composition. Everyone should speak with their doctor before starting or changing an exercise routine.  Biomechanical Limitations Clinical staff conducted group or individual video education with verbal and written material and guidebook.  Patient learns how biomechanical limitations can impact exercise and how we can mitigate and possibly overcome limitations to have an impactful and balanced exercise routine.  Body Composition Clinical staff conducted group or individual video education with verbal and written material and guidebook.  Patient learns that body composition (ratio of muscle mass to fat mass) is a key component to assessing overall fitness, rather than body weight alone. Increased fat mass, especially visceral belly fat, can put [place] at increased risk for metabolic syndrome, type 2 diabetes, heart disease, and even death. It is recommended to combine diet and exercise (cardiovascular and resistance training) to improve your body composition. Seek guidance from your physician and exercise physiologist before implementing an exercise  routine.  Exercise Action Plan Clinical staff conducted group or individual video education with verbal and written material and guidebook.  Patient learns the recommended strategies to achieve and enjoy long-term exercise adherence, including variety, self-motivation, self-efficacy, and positive decision making. Benefits of exercise include fitness, good health, weight management, more energy, better sleep, less stress, and overall well-being.  Medical   Heart Disease Risk Reduction Clinical staff conducted group or individual video education with verbal  and written material and guidebook.  Patient learns our heart is our most vital organ as it circulates oxygen, nutrients, white blood cells, and hormones throughout the entire body, and carries waste away. Data supports a plant-based eating plan like the Pritikin Program for its effectiveness in slowing progression of and reversing heart disease. The video provides a number of recommendations to address heart disease.   Metabolic Syndrome and Belly Fat  Clinical staff conducted group or individual video education with verbal and written material and guidebook.  Patient learns what metabolic syndrome is, how it leads to heart disease, and how one can reverse it and keep it from coming back. You have metabolic syndrome if you have 3 of the following 5 criteria: abdominal obesity, high blood pressure, high triglycerides, low HDL cholesterol, and high blood sugar.  Hypertension and Heart Disease Clinical staff conducted group or individual video education with verbal and written material and guidebook.  Patient learns that high blood pressure, or hypertension, is very common in the [place]. Hypertension is largely due to excessive salt intake, but other important risk factors include being overweight, physical inactivity, drinking too much alcohol, smoking, and not eating enough potassium from fruits and vegetables. High blood pressure is a  leading risk factor for heart attack, stroke, congestive heart failure, dementia, kidney failure, and premature death. Long-term effects of excessive salt intake include stiffening of the arteries and thickening of heart muscle and organ damage. Recommendations include ways to reduce hypertension and the risk of heart disease.  Diseases of Our Time - Focusing on Diabetes Clinical staff conducted group or individual video education with verbal and written material and guidebook.  Patient learns why the best way to stop diseases of our time is prevention, through food and other lifestyle changes. Medicine (such as prescription pills and surgeries) is often only a Band-Aid on the problem, not a long-term solution. Most common diseases of our time include obesity, type 2 diabetes, hypertension, heart disease, and cancer. The Pritikin Program is recommended and has been proven to help reduce, reverse, and/or prevent the damaging effects of metabolic syndrome.  Nutrition   Overview of the Pritikin Eating Plan  Clinical staff conducted group or individual video education with verbal and written material and guidebook.  Patient learns about the [place] for disease risk reduction. The [place] emphasizes a wide variety of unrefined, minimally-processed carbohydrates, like fruits, vegetables, whole grains, and legumes. Go, Caution, and Stop food choices are explained. Plant-based and lean animal proteins are emphasized. Rationale provided for low sodium intake for blood pressure control, low added sugars for blood sugar stabilization, and low added fats and oils for coronary artery disease risk reduction and weight management.  Calorie Density  Clinical staff conducted group or individual video education with verbal and written material and guidebook.  Patient learns about calorie density and how it impacts the Pritikin Eating Plan. Knowing the characteristics of the food you choose will  help you decide whether those foods will lead to weight gain or weight loss, and whether you want to consume more or less of them. Weight loss is usually a side effect of the Pritikin Eating Plan because of its focus on low calorie-dense foods.  Label Reading  Clinical staff conducted group or individual video education with verbal and written material and guidebook.  Patient learns about the Pritikin recommended label reading guidelines and corresponding recommendations regarding calorie density, added sugars, sodium content, and whole grains.  Dining Out -  Part 1  Clinical staff conducted group or individual video education with verbal and written material and guidebook.  Patient learns that restaurant meals can be sabotaging because they can be so high in calories, fat, sodium, and/or sugar. Patient learns recommended strategies on how to positively address this and avoid unhealthy pitfalls.  Facts on Fats  Clinical staff conducted group or individual video education with verbal and written material and guidebook.  Patient learns that lifestyle modifications can be just as effective, if not more so, as many medications for lowering your risk of heart disease. A Pritikin lifestyle can help to reduce your risk of inflammation and atherosclerosis (cholesterol build-up, or plaque, in the artery walls). Lifestyle interventions such as dietary choices and physical activity address the cause of atherosclerosis. A review of the types of fats and their impact on blood cholesterol levels, along with dietary recommendations to reduce fat intake is also included.  Nutrition Action Plan  Clinical staff conducted group or individual video education with verbal and written material and guidebook.  Patient learns how to incorporate Pritikin recommendations into their lifestyle. Recommendations include planning and keeping personal health goals in mind as an important part of their success.  Healthy Mind-Set     Healthy Minds, Bodies, Hearts  Clinical staff conducted group or individual video education with verbal and written material and guidebook.  Patient learns how to identify when they are stressed. Video will discuss the impact of that stress, as well as the many benefits of stress management. Patient will also be introduced to stress management techniques. The way we think, act, and feel has an impact on our hearts.  How Our Thoughts Can Heal Our Hearts  Clinical staff conducted group or individual video education with verbal and written material and guidebook.  Patient learns that negative thoughts can cause depression and anxiety. This can result in negative lifestyle behavior and serious health problems. Cognitive behavioral therapy is an effective method to help control our thoughts in order to change and improve our emotional outlook.  Additional Videos:  Exercise    Improving Performance  Clinical staff conducted group or individual video education with verbal and written material and guidebook.  Patient learns to use a non-linear approach by alternating intensity levels and lengths of time spent exercising to help burn more calories and lose more body fat. Cardiovascular exercise helps improve heart health, metabolism, hormonal balance, blood sugar control, and recovery from fatigue. Resistance training improves strength, endurance, balance, coordination, reaction time, metabolism, and muscle mass. Flexibility exercise improves circulation, posture, and balance. Seek guidance from your physician and exercise physiologist before implementing an exercise routine and learn your capabilities and proper form for all exercise.  Introduction to Yoga  Clinical staff conducted group or individual video education with verbal and written material and guidebook.  Patient learns about yoga, a discipline of the coming together of mind, breath, and body. The benefits of yoga include improved flexibility,  improved range of motion, better posture and core strength, increased lung function, weight loss, and positive self-image. Yoga's heart health benefits include lowered blood pressure, healthier heart rate, decreased cholesterol and triglyceride levels, improved immune function, and reduced stress. Seek guidance from your physician and exercise physiologist before implementing an exercise routine and learn your capabilities and proper form for all exercise.  Medical   Aging: Enhancing Your Quality of Life  Clinical staff conducted group or individual video education with verbal and written material and guidebook.  Patient learns key strategies and  recommendations to stay in good physical health and enhance quality of life, such as prevention strategies, having an advocate, securing a [place], and keeping a list of medications and system for tracking them. It also discusses how to avoid risk for bone loss.  Biology of Weight Control  Clinical staff conducted group or individual video education with verbal and written material and guidebook.  Patient learns that weight gain occurs because we consume more calories than we burn (eating more, moving less). Even if your body weight is normal, you may have higher ratios of fat compared to muscle mass. Too much body fat puts you at increased risk for cardiovascular disease, heart attack, stroke, type 2 diabetes, and obesity-related cancers. In addition to exercise, following the [place] can help reduce your risk.  Decoding Lab Results  Clinical staff conducted group or individual video education with verbal and written material and guidebook.  Patient learns that lab test reflects one measurement whose values change over time and are influenced by many factors, including medication, stress, sleep, exercise, food, hydration, pre-existing medical conditions, and more. It is recommended to use the knowledge from this  video to become more involved with your lab results and evaluate your numbers to speak with your doctor.   Diseases of Our Time - Overview  Clinical staff conducted group or individual video education with verbal and written material and guidebook.  Patient learns that according to the CDC, 50% to 70% of chronic diseases (such as obesity, type 2 diabetes, elevated lipids, hypertension, and heart disease) are avoidable through lifestyle improvements including healthier food choices, listening to satiety cues, and increased physical activity.  Sleep Disorders Clinical staff conducted group or individual video education with verbal and written material and guidebook.  Patient learns how good quality and duration of sleep are important to overall health and well-being. Patient also learns about sleep disorders and how they impact health along with recommendations to address them, including discussing with a physician.  Nutrition  Dining Out - Part 2 Clinical staff conducted group or individual video education with verbal and written material and guidebook.  Patient learns how to plan ahead and communicate in order to maximize their dining experience in a healthy and nutritious manner. Included are recommended food choices based on the type of restaurant the patient is visiting.   Fueling a Best boy conducted group or individual video education with verbal and written material and guidebook.  There is a strong connection between our food choices and our health. Diseases like obesity and type 2 diabetes are very prevalent and are in large-part due to lifestyle choices. The Pritikin Eating Plan provides plenty of food and hunger-curbing satisfaction. It is easy to follow, affordable, and helps reduce health risks.  Menu Workshop  Clinical staff conducted group or individual video education with verbal and written material and guidebook.  Patient learns that restaurant meals can  sabotage health goals because they are often packed with calories, fat, sodium, and sugar. Recommendations include strategies to plan ahead and to communicate with the manager, chef, or server to help order a healthier meal.  Planning Your Eating Strategy  Clinical staff conducted group or individual video education with verbal and written material and guidebook.  Patient learns about the [place] and its benefit of reducing the risk of disease. The [place] does not focus on calories. Instead, it emphasizes high-quality, nutrient-rich foods. By knowing the characteristics  of the foods, we choose, we can determine their calorie density and make informed decisions.  Targeting Your Nutrition Priorities  Clinical staff conducted group or individual video education with verbal and written material and guidebook.  Patient learns that lifestyle habits have a tremendous impact on disease risk and progression. This video provides eating and physical activity recommendations based on your personal health goals, such as reducing LDL cholesterol, losing weight, preventing or controlling type 2 diabetes, and reducing high blood pressure.  Vitamins and Minerals  Clinical staff conducted group or individual video education with verbal and written material and guidebook.  Patient learns different ways to obtain key vitamins and minerals, including through a recommended healthy diet. It is important to discuss all supplements you take with your doctor.   Healthy Mind-Set    Smoking Cessation  Clinical staff conducted group or individual video education with verbal and written material and guidebook.  Patient learns that cigarette smoking and tobacco addiction pose a serious health risk which affects millions of people. Stopping smoking will significantly reduce the risk of heart disease, lung disease, and many forms of cancer. Recommended strategies for quitting are covered, including  working with your doctor to develop a successful plan.  Culinary   Becoming a Financial trader conducted group or individual video education with verbal and written material and guidebook.  Patient learns that cooking at home can be healthy, cost-effective, quick, and puts them in control. Keys to cooking healthy recipes will include looking at your recipe, assessing your equipment needs, planning ahead, making it simple, choosing cost-effective seasonal ingredients, and limiting the use of added fats, salts, and sugars.  Cooking - Breakfast and Snacks  Clinical staff conducted group or individual video education with verbal and written material and guidebook.  Patient learns how important breakfast is to satiety and nutrition through the entire day. Recommendations include key foods to eat during breakfast to help stabilize blood sugar levels and to prevent overeating at meals later in the day. Planning ahead is also a key component.  Cooking - Human resources officer conducted group or individual video education with verbal and written material and guidebook.  Patient learns eating strategies to improve overall health, including an approach to cook more at home. Recommendations include thinking of animal protein as a side on your plate rather than center stage and focusing instead on lower calorie dense options like vegetables, fruits, whole grains, and plant-based proteins, such as beans. Making sauces in large quantities to freeze for later and leaving the skin on your vegetables are also recommended to maximize your experience.  Cooking - Healthy Salads and Dressing Clinical staff conducted group or individual video education with verbal and written material and guidebook.  Patient learns that vegetables, fruits, whole grains, and legumes are the foundations of the [place]. Recommendations include how to incorporate each of these in flavorful and healthy  salads, and how to create homemade salad dressings. Proper handling of ingredients is also covered. Cooking - Soups and [employer] - Soups and Desserts Clinical staff conducted group or individual video education with verbal and written material and guidebook.  Patient learns that Pritikin soups and desserts make for easy, nutritious, and delicious snacks and meal components that are low in sodium, fat, sugar, and calorie density, while high in vitamins, minerals, and filling fiber. Recommendations include simple and healthy ideas for soups and desserts.   Overview     The Pritikin Solution  Program Overview Clinical staff conducted group or individual video education with verbal and written material and guidebook.  Patient learns that the results of the [place] Program have been documented in more than 100 articles published in peer-reviewed journals, and the benefits include reducing risk factors for (and, in some cases, even reversing) high cholesterol, high blood pressure, type 2 diabetes, obesity, and more! An overview of the three key pillars of the Pritikin Program will be covered: eating well, doing regular exercise, and having a healthy mind-set.  WORKSHOPS  Exercise: Exercise Basics: Building Your Action Plan Clinical staff led group instruction and group discussion with PowerPoint presentation and patient guidebook. To enhance the learning environment the use of posters, models and videos may be added. At the conclusion of this workshop, patients will comprehend the difference between physical activity and exercise, as well as the benefits of incorporating both, into their routine. Patients will understand the FITT (Frequency, Intensity, Time, and Type) principle and how to use it to build an exercise action plan. In addition, safety concerns and other considerations for exercise and cardiac rehab will be addressed by the presenter. The purpose of this lesson is to promote a  comprehensive and effective weekly exercise routine in order to improve patients' overall level of fitness.   Managing Heart Disease: Your Path to a Healthier Heart Clinical staff led group instruction and group discussion with PowerPoint presentation and patient guidebook. To enhance the learning environment the use of posters, models and videos may be added.At the conclusion of this workshop, patients will understand the anatomy and physiology of the heart. Additionally, they will understand how Pritikin's three pillars impact the risk factors, the progression, and the management of heart disease.  The purpose of this lesson is to provide a high-level overview of the heart, heart disease, and how the Pritikin lifestyle positively impacts risk factors.  Exercise Biomechanics Clinical staff led group instruction and group discussion with PowerPoint presentation and patient guidebook. To enhance the learning environment the use of posters, models and videos may be added. Patients will learn how the structural parts of their bodies function and how these functions impact their daily activities, movement, and exercise. Patients will learn how to promote a neutral spine, learn how to manage pain, and identify ways to improve their physical movement in order to promote healthy living. The purpose of this lesson is to expose patients to common physical limitations that impact physical activity. Participants will learn practical ways to adapt and manage aches and pains, and to minimize their effect on regular exercise. Patients will learn how to maintain good posture while sitting, walking, and lifting.  Balance Training and Fall Prevention  Clinical staff led group instruction and group discussion with PowerPoint presentation and patient guidebook. To enhance the learning environment the use of posters, models and videos may be added. At the conclusion of this workshop, patients will understand the  importance of their sensorimotor skills (vision, proprioception, and the vestibular system) in maintaining their ability to balance as they age. Patients will apply a variety of balancing exercises that are appropriate for their current level of function. Patients will understand the common causes for poor balance, possible solutions to these problems, and ways to modify their physical environment in order to minimize their fall risk. The purpose of this lesson is to teach patients about the importance of maintaining balance as they age and ways to minimize their risk of falling.  WORKSHOPS   Nutrition:  Fueling a Designer, multimedia  Clinical staff led group instruction and group discussion with PowerPoint presentation and patient guidebook. To enhance the learning environment the use of posters, models and videos may be added. Patients will review the foundational principles of the [place] and understand what constitutes a serving size in each of the food groups. Patients will also learn Pritikin-friendly foods that are better choices when away from home and review make-ahead meal and snack options. Calorie density will be reviewed and applied to three nutrition priorities: weight maintenance, weight loss, and weight gain. The purpose of this lesson is to reinforce (in a group setting) the key concepts around what patients are recommended to eat and how to apply these guidelines when away from home by planning and selecting Pritikin-friendly options. Patients will understand how calorie density may be adjusted for different weight management goals.  Mindful Eating  Clinical staff led group instruction and group discussion with PowerPoint presentation and patient guidebook. To enhance the learning environment the use of posters, models and videos may be added. Patients will briefly review the concepts of the [place] and the importance of low-calorie dense foods. The concept of mindful  eating will be introduced as well as the importance of paying attention to internal hunger signals. Triggers for non-hunger eating and techniques for dealing with triggers will be explored. The purpose of this lesson is to provide patients with the opportunity to review the basic principles of the [place], discuss the value of eating mindfully and how to measure internal cues of hunger and fullness using the Hunger Scale. Patients will also discuss reasons for non-hunger eating and learn strategies to use for controlling emotional eating.  Targeting Your Nutrition Priorities Clinical staff led group instruction and group discussion with PowerPoint presentation and patient guidebook. To enhance the learning environment the use of posters, models and videos may be added. Patients will learn how to determine their genetic susceptibility to disease by reviewing their family history. Patients will gain insight into the importance of diet as part of an overall healthy lifestyle in mitigating the impact of genetics and other environmental insults. The purpose of this lesson is to provide patients with the opportunity to assess their personal nutrition priorities by looking at their family history, their own health history and current risk factors. Patients will also be able to discuss ways of prioritizing and modifying the [place] for their highest risk areas  Menu  Clinical staff led group instruction and group discussion with PowerPoint presentation and patient guidebook. To enhance the learning environment the use of posters, models and videos may be added. Using menus brought in from [employer], or printed from [employer], patients will apply the [place] dining out guidelines that were presented in the [employer] video. Patients will also be able to practice these guidelines in a variety of provided scenarios. The purpose of this lesson is to provide  patients with the opportunity to practice hands-on learning of the [place] with actual menus and practice scenarios.  Label Reading Clinical staff led group instruction and group discussion with PowerPoint presentation and patient guidebook. To enhance the learning environment the use of posters, models and videos may be added. Patients will review and discuss the Pritikin label reading guidelines presented in Pritikin's Label Reading Educational series video. Using fool labels brought in from local grocery stores and markets, patients will apply the label reading guidelines and determine if the packaged food meet the Pritikin  guidelines. The purpose of this lesson is to provide patients with the opportunity to review, discuss, and practice hands-on learning of the Pritikin Label Reading guidelines with actual packaged food labels. [place] Workshops are designed to teach patients ways to prepare quick, simple, and affordable recipes at home. The importance of nutrition's role in chronic disease risk reduction is reflected in its emphasis in the overall Pritikin program. By learning how to prepare essential core Pritikin Eating Plan recipes, patients will increase control over what they eat; be able to customize the flavor of foods without the use of added salt, sugar, or fat; and improve the quality of the food they consume. By learning a set of core recipes which are easily assembled, quickly prepared, and affordable, patients are more likely to prepare more healthy foods at home. These workshops focus on convenient breakfasts, simple entres, side dishes, and desserts which can be prepared with minimal effort and are consistent with nutrition recommendations for cardiovascular risk reduction. Cooking [employer] are taught by a Engineer, materials (RD) who has been trained by the [employer]. The chef or RD has a clear  understanding of the importance of minimizing - if not completely eliminating - added fat, sugar, and sodium in recipes. Throughout the series of [place] Workshop sessions, patients will learn about healthy ingredients and efficient methods of cooking to build confidence in their capability to prepare    Cooking School weekly topics:  Adding Flavor- Sodium-Free  Fast and Healthy Breakfasts  Powerhouse Plant-Based Proteins  Satisfying Salads and Dressings  Simple Sides and Sauces  International Cuisine-Spotlight on the [place] Zones  Delicious Desserts  Savory Soups  [employer] - Meals in a Agricultural consultant Appetizers and Snacks  Comforting Weekend Breakfasts  One-Pot Wonders   Fast Evening Meals  Contractor Your Pritikin Plate  WORKSHOPS   Healthy Mindset (Psychosocial):  Focused Goals, Sustainable Changes Clinical staff led group instruction and group discussion with PowerPoint presentation and patient guidebook. To enhance the learning environment the use of posters, models and videos may be added. Patients will be able to apply effective goal setting strategies to establish at least one personal goal, and then take consistent, meaningful action toward that goal. They will learn to identify common barriers to achieving personal goals and develop strategies to overcome them. Patients will also gain an understanding of how our mind-set can impact our ability to achieve goals and the importance of cultivating a positive and growth-oriented mind-set. The purpose of this lesson is to provide patients with a deeper understanding of how to set and achieve personal goals, as well as the tools and strategies needed to overcome common obstacles which may arise along the way.  From Head to Heart: The Power of a Healthy Outlook  Clinical staff led group instruction and group discussion with PowerPoint presentation and patient guidebook. To enhance the learning  environment the use of posters, models and videos may be added. Patients will be able to recognize and describe the impact of emotions and mood on physical health. They will discover the importance of self-care and explore self-care practices which may work for them. Patients will also learn how to utilize the 4 C's to cultivate a healthier outlook and better manage stress and challenges. The purpose of this lesson is to demonstrate to patients how a healthy outlook is an essential part of maintaining good health, especially as they continue their cardiac rehab  journey.  Healthy Sleep for a Healthy Heart Clinical staff led group instruction and group discussion with PowerPoint presentation and patient guidebook. To enhance the learning environment the use of posters, models and videos may be added. At the conclusion of this workshop, patients will be able to demonstrate knowledge of the importance of sleep to overall health, well-being, and quality of life. They will understand the symptoms of, and treatments for, common sleep disorders. Patients will also be able to identify daytime and nighttime behaviors which impact sleep, and they will be able to apply these tools to help manage sleep-related challenges. The purpose of this lesson is to provide patients with a general overview of sleep and outline the importance of quality sleep. Patients will learn about a few of the most common sleep disorders. Patients will also be introduced to the concept of "sleep hygiene," and discover ways to self-manage certain sleeping problems through simple daily behavior changes. Finally, the workshop will motivate patients by clarifying the links between quality sleep and their goals of heart-healthy living.   Recognizing and Reducing Stress Clinical staff led group instruction and group discussion with PowerPoint presentation and patient guidebook. To enhance the learning environment the use of posters, models and videos  may be added. At the conclusion of this workshop, patients will be able to understand the types of stress reactions, differentiate between acute and chronic stress, and recognize the impact that chronic stress has on their health. They will also be able to apply different coping mechanisms, such as reframing negative self-talk. Patients will have the opportunity to practice a variety of stress management techniques, such as deep abdominal breathing, progressive muscle relaxation, and/or guided imagery.  The purpose of this lesson is to educate patients on the role of stress in their lives and to provide healthy techniques for coping with it.  Learning Barriers/Preferences:  Learning Barriers/Preferences - 10/07/22 1224       Learning Barriers/Preferences   Learning Barriers Sight;Hearing   wears glasses, in process of getting hearing aids   Learning Preferences Audio;Computer/Internet;Individual Instruction;Group Instruction;Pictoral;Skilled Demonstration;Verbal Instruction;Video;Written Material             Education Topics:  Knowledge Questionnaire Score:  Knowledge Questionnaire Score - 10/07/22 1226       Knowledge Questionnaire Score   Pre Score 19/24             Core Components/Risk Factors/Patient Goals at Admission:  Personal Goals and Risk Factors at Admission - 10/07/22 1226       Core Components/Risk Factors/Patient Goals on Admission   Hypertension Yes    Intervention Provide education on lifestyle modifcations including regular physical activity/exercise, weight management, moderate sodium restriction and increased consumption of fresh fruit, vegetables, and low fat dairy, alcohol moderation, and smoking cessation.;Monitor prescription use compliance.    Expected Outcomes Short Term: Continued assessment and intervention until BP is < 140/65mm HG in hypertensive participants. < 130/58mm HG in hypertensive participants with diabetes, heart failure or chronic kidney  disease.;Long Term: Maintenance of blood pressure at goal levels.    Lipids Yes    Intervention Provide education and support for participant on nutrition & aerobic/resistive exercise along with prescribed medications to achieve LDL 70mg , HDL >40mg .    Expected Outcomes Short Term: Participant states understanding of desired cholesterol values and is compliant with medications prescribed. Participant is following exercise prescription and nutrition guidelines.;Long Term: Cholesterol controlled with medications as prescribed, with individualized exercise RX and with personalized nutrition plan. Value goals: LDL <  70mg , HDL > 40 mg.             Core Components/Risk Factors/Patient Goals Review:   Goals and Risk Factor Review     Row Name 10/15/22 1647 10/20/22 1327 11/16/22 1039 12/08/22 1801       Core Components/Risk Factors/Patient Goals Review   Personal Goals Review Hypertension;Lipids Hypertension;Lipids Hypertension;Lipids Hypertension;Lipids    Review Dorris started intensive cardiac rehab on 10/15/22 and did well with exercise Freddye is off to a good start to exercise for her fitness level at  intensive cardiac rehab. Dakari has chronic athritis pain. Blair is doing well with exercise at intensive cardiac rehab. [name] has lost 1.5 kg since starting the program. Will contiunue to monitor as [name] was lightheaded yesterday resolved with H20. Will continue to monitor BP Annette is doing well with exercise at intensive cardiac rehab. [name] has lost 6.4 kg since starting the program. Vital signs have been stable    Expected Outcomes Aritha will continue to participate in intensive cardiac rehab for exercise, nutrition and lifestyle modifications Saquana will continue to participate in intensive cardiac rehab for exercise, nutrition and lifestyle modifications Teighan will continue to participate in intensive cardiac rehab for exercise, nutrition and lifestyle modifications Susa will continue  to participate in intensive cardiac rehab for exercise, nutrition and lifestyle modifications             Core Components/Risk Factors/Patient Goals at Discharge (Final Review):   Goals and Risk Factor Review - 12/08/22 1801       Core Components/Risk Factors/Patient Goals Review   Personal Goals Review Hypertension;Lipids    Review Onika is doing well with exercise at intensive cardiac rehab. [name] has lost 6.4 kg since starting the program. 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 Introduction to pritikin education program/ intensive cardiac rehab. Initial orientation packet reviewed with patient. 30 Day ITP Review. Lorrane started 30 Day ITP Review. Reni is off to a good start to exercise at intensive cardiac rehab 30 Day ITP Review. Aideen has good attendance and participation in  exercise at intensive cardiac rehab 30 Day ITP Review. Neeve continues to have  good attendance and participation in  exercise at intensive cardiac rehab. Quentina will complete intensive cardiac rehab at the beginning of April            Comments: See ITP comments.Harrell Gave RN BSN

## 2022-12-10 ENCOUNTER — Encounter (HOSPITAL_COMMUNITY)
Admission: RE | Admit: 2022-12-10 | Discharge: 2022-12-10 | Disposition: A | Discharge status: Home / Self Care | Payer: Medicare Other | Source: Ambulatory Visit | Attending: Cardiovascular Disease | Admitting: Cardiovascular Disease

## 2022-12-10 DIAGNOSIS — Z48812 Encounter for surgical aftercare following surgery on the circulatory system: Secondary | ICD-10-CM | POA: Diagnosis not present

## 2022-12-10 DIAGNOSIS — Z955 Presence of coronary angioplasty implant and graft: Secondary | ICD-10-CM

## 2022-12-13 ENCOUNTER — Encounter (HOSPITAL_COMMUNITY)
Admission: RE | Admit: 2022-12-13 | Discharge: 2022-12-13 | Disposition: A | Discharge status: Home / Self Care | Payer: Medicare Other | Source: Ambulatory Visit | Attending: Cardiovascular Disease | Admitting: Cardiovascular Disease

## 2022-12-13 DIAGNOSIS — Z955 Presence of coronary angioplasty implant and graft: Secondary | ICD-10-CM | POA: Diagnosis not present

## 2022-12-13 DIAGNOSIS — Z48812 Encounter for surgical aftercare following surgery on the circulatory system: Secondary | ICD-10-CM | POA: Diagnosis not present

## 2022-12-15 ENCOUNTER — Encounter (HOSPITAL_COMMUNITY)
Admission: RE | Admit: 2022-12-15 | Discharge: 2022-12-15 | Disposition: A | Discharge status: Home / Self Care | Payer: Medicare Other | Source: Ambulatory Visit | Attending: Cardiovascular Disease | Admitting: Cardiovascular Disease

## 2022-12-15 DIAGNOSIS — Z955 Presence of coronary angioplasty implant and graft: Secondary | ICD-10-CM | POA: Diagnosis not present

## 2022-12-15 DIAGNOSIS — Z48812 Encounter for surgical aftercare following surgery on the circulatory system: Secondary | ICD-10-CM | POA: Diagnosis not present

## 2022-12-17 ENCOUNTER — Encounter (HOSPITAL_COMMUNITY)
Admission: RE | Admit: 2022-12-17 | Discharge: 2022-12-17 | Disposition: A | Discharge status: Home / Self Care | Payer: Medicare Other | Source: Ambulatory Visit | Attending: Cardiovascular Disease | Admitting: Cardiovascular Disease

## 2022-12-17 DIAGNOSIS — Z48812 Encounter for surgical aftercare following surgery on the circulatory system: Secondary | ICD-10-CM | POA: Diagnosis not present

## 2022-12-17 DIAGNOSIS — Z955 Presence of coronary angioplasty implant and graft: Secondary | ICD-10-CM | POA: Diagnosis not present

## 2022-12-20 ENCOUNTER — Encounter (HOSPITAL_COMMUNITY)
Admission: RE | Admit: 2022-12-20 | Discharge: 2022-12-20 | Disposition: A | Discharge status: Home / Self Care | Payer: Medicare Other | Source: Ambulatory Visit | Attending: Cardiovascular Disease | Admitting: Cardiovascular Disease

## 2022-12-20 VITALS — Ht 65.0 in | Wt 168.0 lb

## 2022-12-20 DIAGNOSIS — Z955 Presence of coronary angioplasty implant and graft: Secondary | ICD-10-CM | POA: Diagnosis not present

## 2022-12-22 ENCOUNTER — Encounter (HOSPITAL_COMMUNITY)
Admission: RE | Admit: 2022-12-22 | Discharge: 2022-12-22 | Disposition: A | Discharge status: Home / Self Care | Payer: Medicare Other | Source: Ambulatory Visit | Attending: Cardiovascular Disease | Admitting: Cardiovascular Disease

## 2022-12-22 DIAGNOSIS — Z955 Presence of coronary angioplasty implant and graft: Secondary | ICD-10-CM

## 2022-12-24 ENCOUNTER — Encounter (HOSPITAL_COMMUNITY)
Admission: RE | Admit: 2022-12-24 | Discharge: 2022-12-24 | Disposition: A | Discharge status: Home / Self Care | Payer: Medicare Other | Source: Ambulatory Visit | Attending: Cardiovascular Disease | Admitting: Cardiovascular Disease

## 2022-12-24 DIAGNOSIS — Z955 Presence of coronary angioplasty implant and graft: Secondary | ICD-10-CM | POA: Diagnosis not present

## 2022-12-24 NOTE — Progress Notes (Addendum)
Discharge Progress Report  Patient Details  Name: Alyssa BranchDianne H Mccook MRN: [id number] Date of Birth: Jun 24, 1941 Referring Provider:   Flowsheet Row INTENSIVE CARDIAC REHAB ORIENT [facility]  Referring Provider Lennie OdorWesley O'Neal, MD        Number of Visits: 50  Reason for Discharge:  Patient reached a stable level of exercise. Patient independent in their exercise. Patient has met program and personal goals.  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  ADL UCSD:   Initial Exercise Prescription:  Initial Exercise Prescription - 10/07/22 1200       Date of Initial Exercise RX and Referring Provider   Date 10/07/22    Referring Provider Lennie OdorWesley O'Neal, MD    Expected Discharge Date 12/03/22      Recumbant Bike   Level 1    RPM 60    Minutes 15    METs 2      NuStep   Level 1    SPM 75    Minutes 15    METs 1.7      Prescription Details   Frequency (times per week) 3    Duration Progress to 30 minutes of continuous aerobic without signs/symptoms of physical distress      Intensity   THRR 40-80% of Max Heartrate 56-112    Ratings of Perceived Exertion 11-13    Perceived Dyspnea 0-4      Progression   Progression Continue to progress workloads to maintain intensity without signs/symptoms of physical distress.      Resistance Training   Training Prescription Yes    Weight 2    Reps 10-15             Discharge Exercise Prescription (Final Exercise Prescription Changes):  Exercise Prescription Changes - 12/24/22 1629       Response to Exercise   Blood Pressure (Admit) 122/68    Blood Pressure (Exercise) 112/62    Blood Pressure (Exit) 104/60    Heart Rate (Admit) 69 bpm    Heart Rate (Exercise) 80 bpm     Heart Rate (Exit) 71 bpm    Rating of Perceived Exertion (Exercise) 12    Symptoms None    Comments Pr graduated the [employer] [facility]    Duration Progress to 30 minutes of  aerobic without signs/symptoms of physical distress    Intensity THRR unchanged      Progression   Progression Continue to progress workloads to maintain intensity without signs/symptoms of physical distress.    Average METs 1.95      Resistance Training   Training Prescription Yes    Weight 3 lbs wts    Reps 10-15    Time 10 Minutes      Recumbant Bike   Level 3    RPM 58    Watts 16    Minutes 15    METs 1.7      NuStep   Level 3    SPM 90    Minutes 15    METs 2.2             Functional Capacity:  6 Minute Walk     Row Name 10/07/22 1218 12/20/22 1640       6 Minute  Walk   Phase Initial Discharge    Distance 979 feet 1351 feet    Distance % Change -- 38 %    Distance Feet Change -- 372 ft    Walk Time 6 minutes 6 minutes    # of Rest Breaks 0 0    MPH 1.85 2.56    METS 1.56 2.3    RPE 11 11    Perceived Dyspnea  1 1    VO2 Peak 5.55 8.04    Symptoms No Yes (comment)    Comments -- Bilateral leg chronic pain arthritis 7/10    Resting HR 76 bpm 76 bpm    Resting BP 118/68 110/58    Resting Oxygen Saturation  100 % 98 %    Exercise Oxygen Saturation  during 6 min walk 100 % --    Max Ex. HR 94 bpm 98 bpm    Max Ex. BP 126/64 122/60    2 Minute Post BP 122/70 108/58             Psychological, QOL, Others - Outcomes: PHQ 2/9:    12/24/2022    3:12 PM 10/07/2022   12:16 PM  Depression screen PHQ 2/9  Decreased Interest 0 0  Down, Depressed, Hopeless 0 1  PHQ - 2 Score 0 1  Altered sleeping 1 2  Tired, decreased energy 0 3  Change in appetite 0 1  Feeling bad or failure about yourself  0 0  Trouble concentrating 0 0  Moving slowly or fidgety/restless 0 1  Suicidal thoughts 0 0  PHQ-9 Score 1 8  Difficult doing work/chores Not difficult at all Not difficult at all     Quality of Life:  Quality of Life - 12/20/22 1422       Quality of Life   Select Quality of Life      Quality of Life Scores   Health/Function Post 22.96 %    Socioeconomic Post 23.14 %    Psych/Spiritual Post 25.71 %    Family Post 26 %    GLOBAL Post 23.92 %             Personal Goals: Goals established at orientation with interventions provided to work toward goal.  Personal Goals and Risk Factors at Admission - 10/07/22 1226       Core Components/Risk Factors/Patient Goals on Admission   Hypertension Yes    Intervention Provide education on lifestyle modifcations including regular physical activity/exercise, weight management, moderate sodium restriction and increased consumption of fresh fruit, vegetables, and low fat dairy, alcohol moderation, and smoking cessation.;Monitor prescription use compliance.    Expected Outcomes Short Term: Continued assessment and intervention until BP is < 140/4590mm HG in hypertensive participants. < 130/2880mm HG in hypertensive participants with diabetes, heart failure or chronic kidney disease.;Long Term: Maintenance of blood pressure at goal levels.    Lipids Yes    Intervention Provide education and support for participant on nutrition & aerobic/resistive exercise along with prescribed medications to achieve LDL 70mg , HDL >40mg .    Expected Outcomes Short Term: Participant states understanding of desired cholesterol values and is compliant with medications prescribed. Participant is following exercise prescription and nutrition guidelines.;Long Term: Cholesterol controlled with medications as prescribed, with individualized exercise RX and with personalized nutrition plan. Value goals: LDL < 70mg , HDL > 40 mg.              Personal Goals Discharge:  Goals and Risk Factor Review     Row Name  10/15/22 1647 10/20/22 1327 11/16/22 1039 12/08/22 1801       Core Components/Risk Factors/Patient Goals Review   Personal Goals Review  Hypertension;Lipids Hypertension;Lipids Hypertension;Lipids Hypertension;Lipids    Review Jesse started intensive cardiac rehab on 10/15/22 and did well with exercise Zylynn is off to a good start to exercise for her fitness level at  intensive cardiac rehab. 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 Asianna is doing well with exercise at intensive cardiac rehab. [name] has lost 1.5 kg since starting the program. Will contiunue to monitor as [name] was lightheaded yesterday resolved with H20. Will continue to monitor BP Katora is doing well with exercise at intensive cardiac rehab. [name] has lost 6.4 kg since starting the program. Vital signs have been stable    Expected Outcomes Arthurine will continue to participate in intensive cardiac rehab for exercise, nutrition and lifestyle modifications Keerstin will continue to participate in intensive cardiac rehab for exercise, nutrition and lifestyle modifications Jnia will continue to participate in intensive cardiac rehab for exercise, nutrition and lifestyle modifications Seraya will continue to participate in intensive cardiac rehab for exercise, nutrition and lifestyle modifications             Exercise Goals and Review:  Exercise Goals     Row Name 10/07/22 1222             Exercise Goals   Increase Physical Activity Yes       Intervention Provide advice, education, support and counseling about physical activity/exercise needs.;Develop an individualized exercise prescription for aerobic and resistive training based on initial evaluation findings, risk stratification, comorbidities and participant's personal goals.       Expected Outcomes Short Term: Attend rehab on a regular basis to increase amount of physical activity.;Long Term: Exercising regularly at least 3-5 days a week.;Long Term: Add in home exercise to make exercise part of routine and to increase amount of physical activity.       Increase Strength and Stamina Yes       Intervention Provide  advice, education, support and counseling about physical activity/exercise needs.;Develop an individualized exercise prescription for aerobic and resistive training based on initial evaluation findings, risk stratification, comorbidities and participant's personal goals.       Expected Outcomes Short Term: Increase workloads from initial exercise prescription for resistance, speed, and METs.;Short Term: Perform resistance training exercises routinely during rehab and add in resistance training at home;Long Term: Improve cardiorespiratory fitness, muscular endurance and strength as measured by increased METs and functional capacity ( )       Able to understand and use rate of perceived exertion (RPE) scale Yes       Intervention Provide education and explanation on how to use RPE scale       Expected Outcomes Short Term: Able to use RPE daily in rehab to express subjective intensity level;Long Term:  Able to use RPE to guide intensity level when exercising independently       Knowledge and understanding of Target Heart Rate Range (THRR) Yes       Intervention Provide education and explanation of THRR including how the numbers were predicted and where they are located for reference       Expected Outcomes Short Term: Able to state/look up THRR;Short Term: Able to use daily as guideline for intensity in rehab;Long Term: Able to use THRR to govern intensity when exercising independently       Understanding of Exercise Prescription Yes       Intervention Provide education, explanation,  and written materials on patient's individual exercise prescription       Expected Outcomes Short Term: Able to explain program exercise prescription;Long Term: Able to explain home exercise prescription to exercise independently                Exercise Goals Re-Evaluation:  Exercise Goals Re-Evaluation     Row Name 10/13/22 1625 10/29/22 1639 12/01/22 1715 12/24/22 1631       Exercise Goal Re-Evaluation    Exercise Goals Review Increase Physical Activity;Understanding of Exercise Prescription;Increase Strength and Stamina;Knowledge and understanding of Target Heart Rate Range (THRR);Able to understand and use rate of perceived exertion (RPE) scale Increase Physical Activity;Understanding of Exercise Prescription;Increase Strength and Stamina;Knowledge and understanding of Target Heart Rate Range (THRR);Able to understand and use rate of perceived exertion (RPE) scale Increase Physical Activity;Understanding of Exercise Prescription;Increase Strength and Stamina;Knowledge and understanding of Target Heart Rate Range (THRR);Able to understand and use rate of perceived exertion (RPE) scale Increase Physical Activity;Understanding of Exercise Prescription;Increase Strength and Stamina;Knowledge and understanding of Target Heart Rate Range (THRR);Able to understand and use rate of perceived exertion (RPE) scale    Comments Pt first day in the [facility] [facility] program. Pt tolerated exercise well with an average MET level of 1.85. Pt is learning her THRR, RPE and ExRx. But pt felt good about her first day. Reviewed MET's, goals and home ExRx. Pt tolerated exercise well with an average MET level of 1.95. Pt is making slow progressions and talked more about increasing SPM and RPM to help achieve greater MET levels. Pt feels good about her goals so far and feels she is gaining strength, stamina and becoming more independent. Pt also states she feels her housework is becoming easier. Pt will exercise on her own 3-4 days by walking for 30-45 mins per session, also gave pt weight and stretches packet to use on her own. Reviewed MET's and goals. Pt tolerated exercise well with an average MET level of 2.3. Pt is progressing well and feels good about her exercise. She is increacing strength, stamina and her independance. Pt feels good about her goals so far and has a very positive attitude Pt graduated the [employer] [facility]. Pt  tolerated exercise well with an average MET level of 1.95. Pt enjoyed the program and states that she has loved exercise and the education provided. She will continue to exercise onher own by walking and [facility] for 30-60 mins per session    Expected Outcomes Will continue to monitor pt and progress workloads as tolerated without sign or symptom Will continue to monitor pt and progress workloads as tolerated without sign or symptom Will continue to monitor pt and progress workloads as tolerated without sign or symptom Pt will continue to exercise on her own and gain strength             Nutrition & Weight - Outcomes:  Pre Biometrics - 10/07/22 1215       Pre Biometrics   Waist Circumference 36.5 inches    Hip Circumference 44.75 inches    Waist to Hip Ratio 0.82 %    Triceps Skinfold 38 mm    % Body Fat 42.5 %    Grip Strength 22 kg    Flexibility 19.5 in    Single Leg Stand 10.43 seconds             Post Biometrics - 12/20/22 1644        Post  Biometrics  Height 5\' 5"  (1.651 m)    Weight 76.2 kg    Waist Circumference 38 inches    Hip Circumference 45.5 inches    Waist to Hip Ratio 0.84 %    BMI (Calculated) 27.96    Triceps Skinfold 32 mm    % Body Fat 41.7 %    Grip Strength 22 kg    Flexibility 17.75 in    Single Leg Stand 5.8 seconds             Nutrition:  Nutrition Therapy & Goals - 12/06/22 1602       Nutrition Therapy   Diet Heart healthy diet    Drug/Food Interactions Statins/Certain Fruits      Personal Nutrition Goals   Nutrition Goal Patient to identify strategies for managing cardiovascular risk by attending the [employer] education and nutrition series weekly    Personal Goal #2 Patient to improve diet quality by using the plate method as a daily guide for meal planning to include lean protien/plant protein, fruits, vegetables, whole grains, and nonfat/lowfat dairy as part of balanced diet.    Comments Goals in action.  Graciella BeltonDianne continues to attend the [employer] education and nutrition series regularly. She has increased high fiber foods, reduce sodium, and continues moderation of saturated fat. Graciella BeltonDianne will benefit from participation in intensive cardiac rehab for nutrition, exercise, and lifestyle modification.      Intervention Plan   Intervention Prescribe, educate and counsel regarding individualized specific dietary modifications aiming towards targeted core components such as weight, hypertension, lipid management, diabetes, heart failure and other comorbidities.;Nutrition handout(s) given to patient.    Expected Outcomes Short Term Goal: Understand basic principles of dietary content, such as calories, fat, sodium, cholesterol and nutrients.;Long Term Goal: Adherence to prescribed nutrition plan.             Nutrition Discharge:  Nutrition Assessments - 12/27/22 0831       Rate Your Plate Scores   Post Score 65             Education Questionnaire Score:  Knowledge Questionnaire Score - 12/20/22 1424       Knowledge Questionnaire Score   Post Score 24/24             Goals reviewed with patient; copy given to patient.Pt graduates from  Intensive cardiac rehab program 12/24/22 with completion of  50 exercise and education sessions. Pt maintained good attendance and progressed nicely during their participation in rehab as evidenced by increased MET level.   Medication list reconciled. Repeat  PHQ score-. Pt has made significant lifestyle changes and should be commended for their success. Everline achieved their goals during cardiac rehab.Graciella BeltonDianne is particularly proud of her weight loss! [name] increased her distance by 372 feet.   [facility] Graciella BeltonDianne has lost 2.7 kg since starting cardiac rehab. We are proud of Katrin's progress!Thayer HeadingsMaria Walden Shana Younge RN BSN

## 2023-02-16 DIAGNOSIS — H9201 Otalgia, right ear: Secondary | ICD-10-CM | POA: Diagnosis not present

## 2023-03-11 DIAGNOSIS — R350 Frequency of micturition: Secondary | ICD-10-CM | POA: Diagnosis not present

## 2023-04-18 DIAGNOSIS — H6121 Impacted cerumen, right ear: Secondary | ICD-10-CM | POA: Insufficient documentation

## 2023-04-18 DIAGNOSIS — H9193 Unspecified hearing loss, bilateral: Secondary | ICD-10-CM | POA: Insufficient documentation

## 2023-04-18 DIAGNOSIS — H93293 Other abnormal auditory perceptions, bilateral: Secondary | ICD-10-CM | POA: Diagnosis not present

## 2023-04-19 DIAGNOSIS — M8589 Other specified disorders of bone density and structure, multiple sites: Secondary | ICD-10-CM | POA: Diagnosis not present

## 2023-04-19 DIAGNOSIS — E785 Hyperlipidemia, unspecified: Secondary | ICD-10-CM | POA: Diagnosis not present

## 2023-04-19 DIAGNOSIS — E559 Vitamin D deficiency, unspecified: Secondary | ICD-10-CM | POA: Diagnosis not present

## 2023-04-19 DIAGNOSIS — R7303 Prediabetes: Secondary | ICD-10-CM | POA: Diagnosis not present

## 2023-04-26 DIAGNOSIS — Z0000 Encounter for general adult medical examination without abnormal findings: Secondary | ICD-10-CM | POA: Diagnosis not present

## 2023-04-26 DIAGNOSIS — M8580 Other specified disorders of bone density and structure, unspecified site: Secondary | ICD-10-CM | POA: Diagnosis not present

## 2023-04-26 DIAGNOSIS — R350 Frequency of micturition: Secondary | ICD-10-CM | POA: Diagnosis not present

## 2023-04-26 DIAGNOSIS — E785 Hyperlipidemia, unspecified: Secondary | ICD-10-CM | POA: Diagnosis not present

## 2023-04-26 DIAGNOSIS — M150 Primary generalized (osteo)arthritis: Secondary | ICD-10-CM | POA: Diagnosis not present

## 2023-04-26 DIAGNOSIS — R030 Elevated blood-pressure reading, without diagnosis of hypertension: Secondary | ICD-10-CM | POA: Diagnosis not present

## 2023-04-26 DIAGNOSIS — R82998 Other abnormal findings in urine: Secondary | ICD-10-CM | POA: Diagnosis not present

## 2023-04-26 DIAGNOSIS — I700 Atherosclerosis of aorta: Secondary | ICD-10-CM | POA: Diagnosis not present

## 2023-04-26 DIAGNOSIS — K2270 Barrett's esophagus without dysplasia: Secondary | ICD-10-CM | POA: Diagnosis not present

## 2023-04-26 DIAGNOSIS — Z13828 Encounter for screening for other musculoskeletal disorder: Secondary | ICD-10-CM | POA: Diagnosis not present

## 2023-05-30 DIAGNOSIS — H903 Sensorineural hearing loss, bilateral: Secondary | ICD-10-CM | POA: Diagnosis not present

## 2023-06-08 DIAGNOSIS — L820 Inflamed seborrheic keratosis: Secondary | ICD-10-CM | POA: Diagnosis not present

## 2023-06-08 DIAGNOSIS — L570 Actinic keratosis: Secondary | ICD-10-CM | POA: Diagnosis not present

## 2023-06-08 DIAGNOSIS — D1801 Hemangioma of skin and subcutaneous tissue: Secondary | ICD-10-CM | POA: Diagnosis not present

## 2023-06-08 DIAGNOSIS — L821 Other seborrheic keratosis: Secondary | ICD-10-CM | POA: Diagnosis not present

## 2023-06-08 DIAGNOSIS — D225 Melanocytic nevi of trunk: Secondary | ICD-10-CM | POA: Diagnosis not present

## 2023-06-08 DIAGNOSIS — L400 Psoriasis vulgaris: Secondary | ICD-10-CM | POA: Diagnosis not present

## 2023-06-10 DIAGNOSIS — I25118 Atherosclerotic heart disease of native coronary artery with other forms of angina pectoris: Secondary | ICD-10-CM | POA: Diagnosis not present

## 2023-06-28 IMAGING — MG MM DIGITAL SCREENING BILAT W/ TOMO AND CAD
8 series · 8 of 24 positions shown · non-contrast
Comparison: Previous exam(s).

CLINICAL DATA: Screening.

EXAM:
DIGITAL SCREENING BILATERAL MAMMOGRAM WITH TOMOSYNTHESIS AND CAD
TECHNIQUE: Bilateral screening digital craniocaudal and mediolateral oblique
mammograms were obtained. Bilateral screening digital breast
tomosynthesis was performed. The images were evaluated with
computer-aided detection.

[R CC synth-2D]
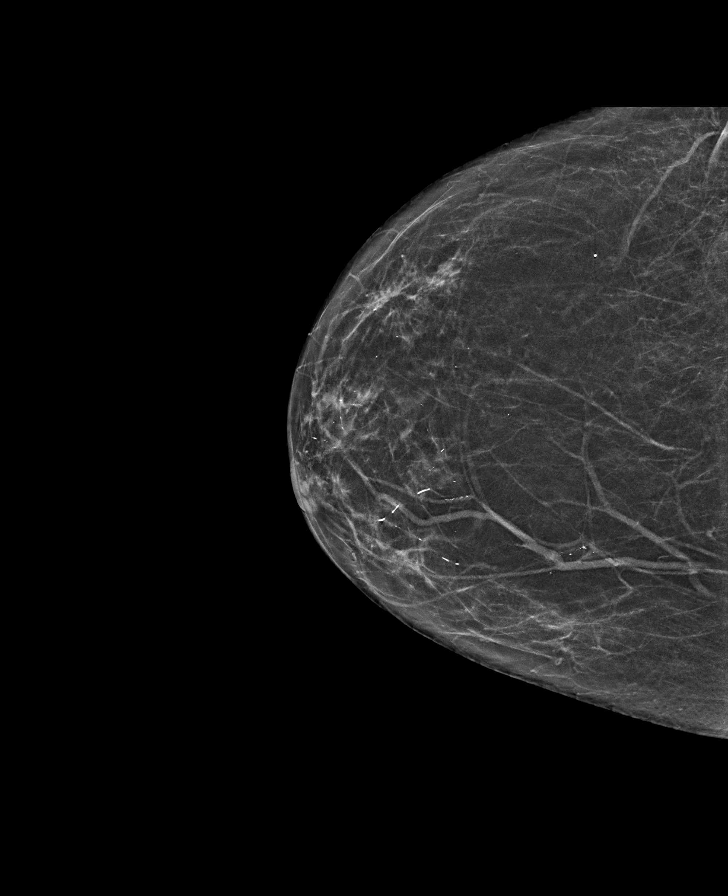

[R MLO synth-2D]
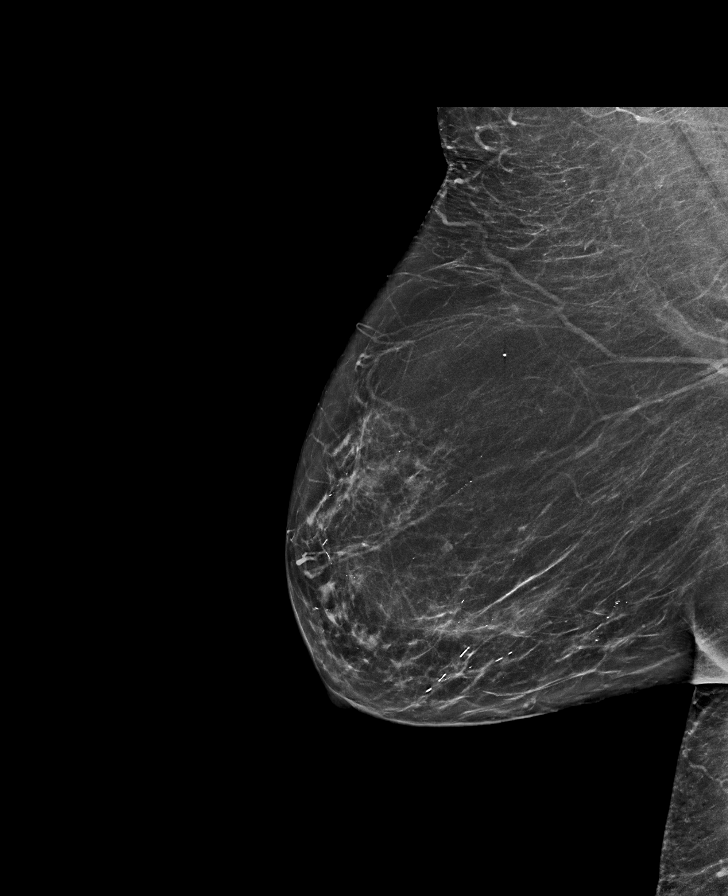

[L CC synth-2D]
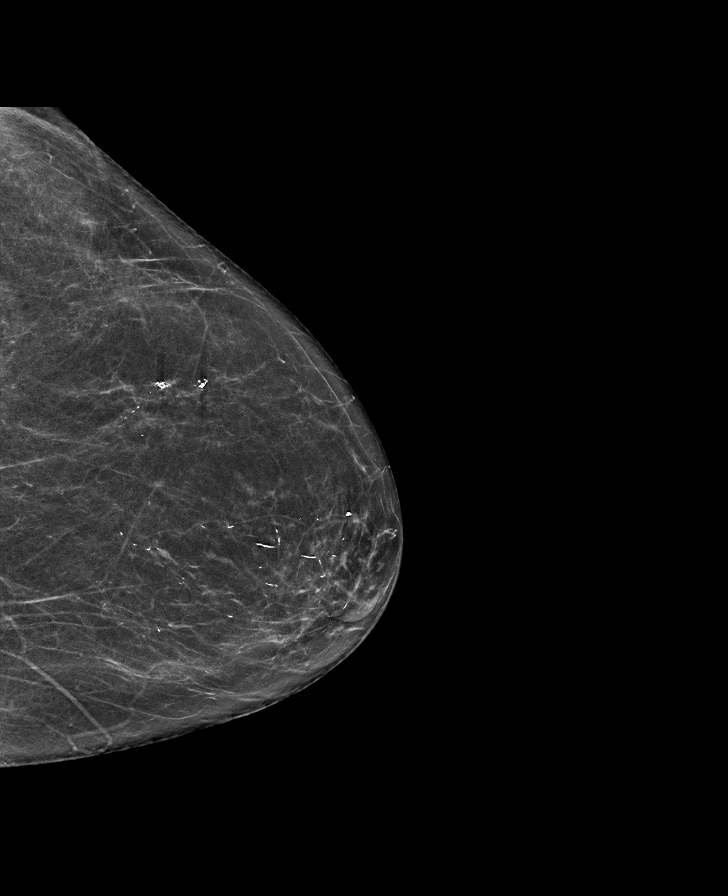

[L MLO synth-2D]
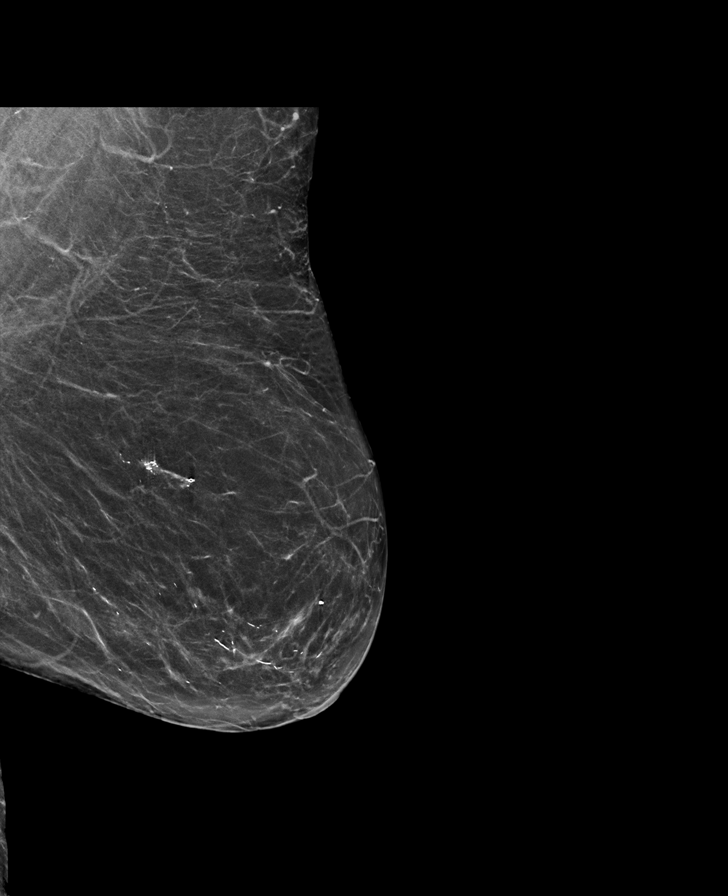

[L CC tomo · tomo slice 32/63.0]
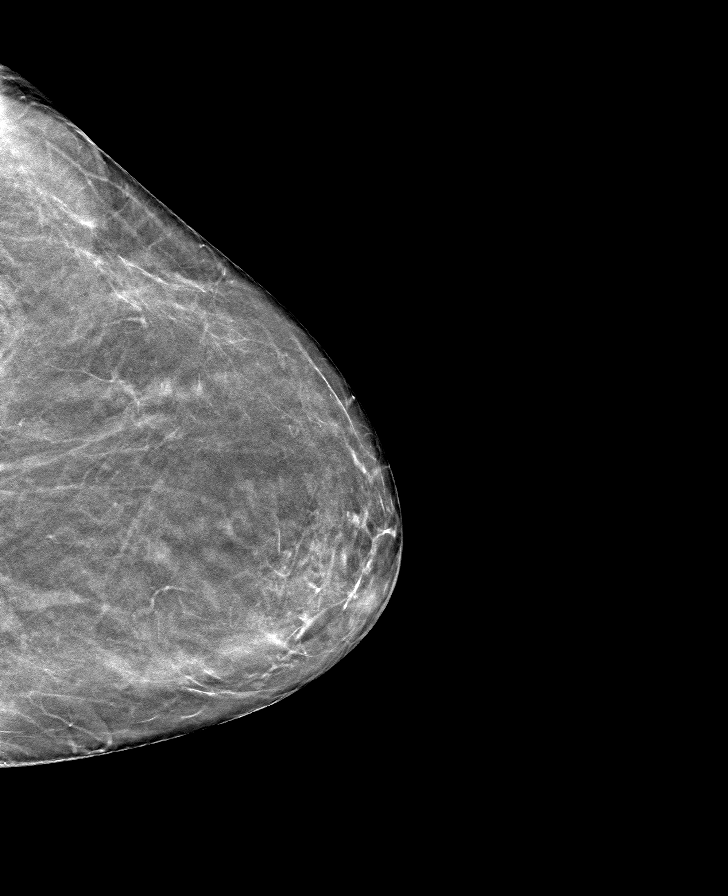

[R CC tomo · tomo slice 29/57.0]
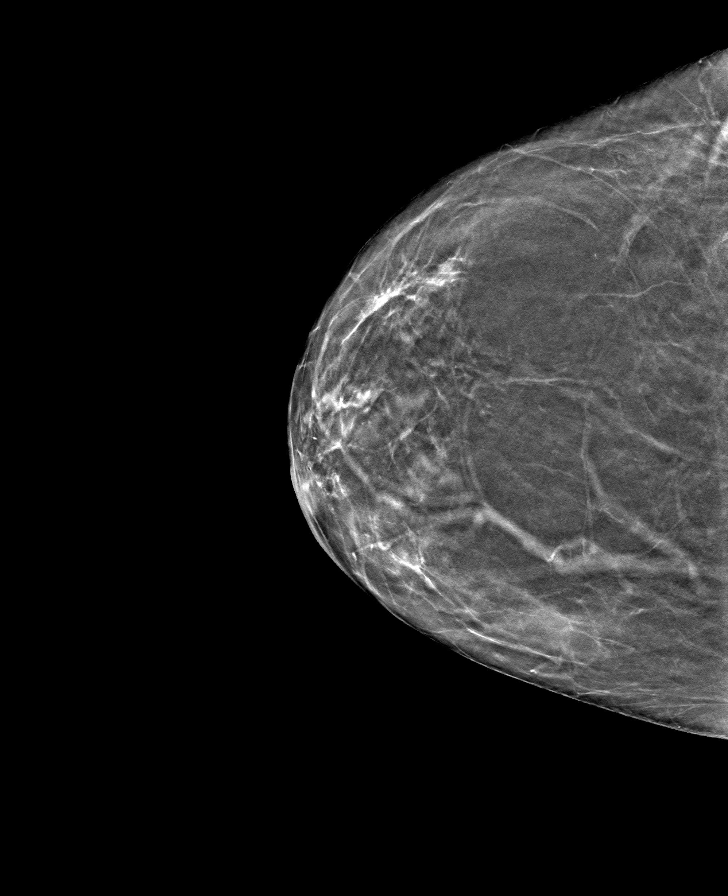

[L MLO tomo · tomo slice 35/69.0]
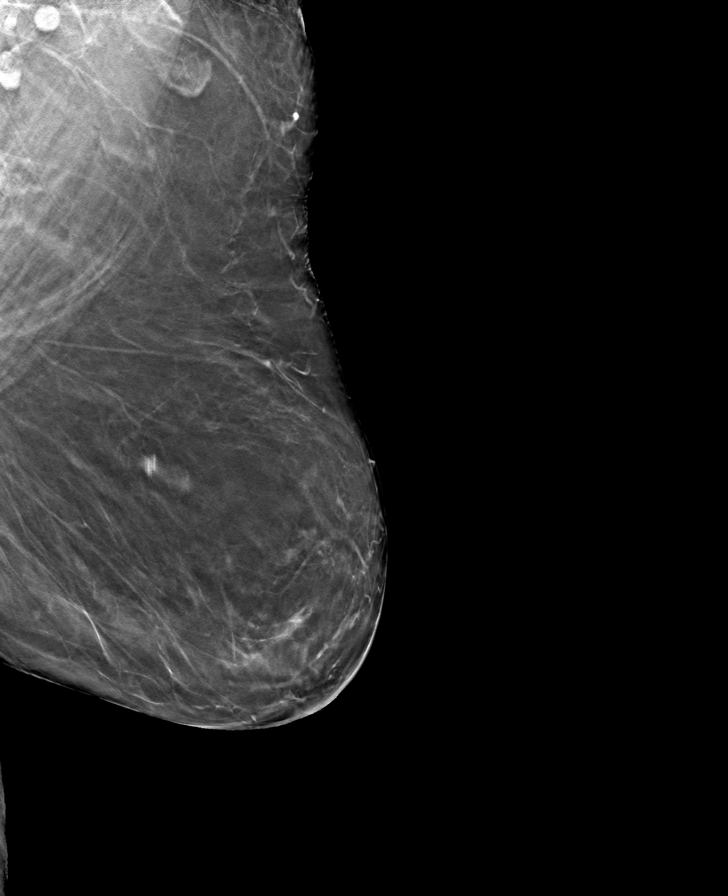

[R MLO tomo · tomo slice 35/68.0]
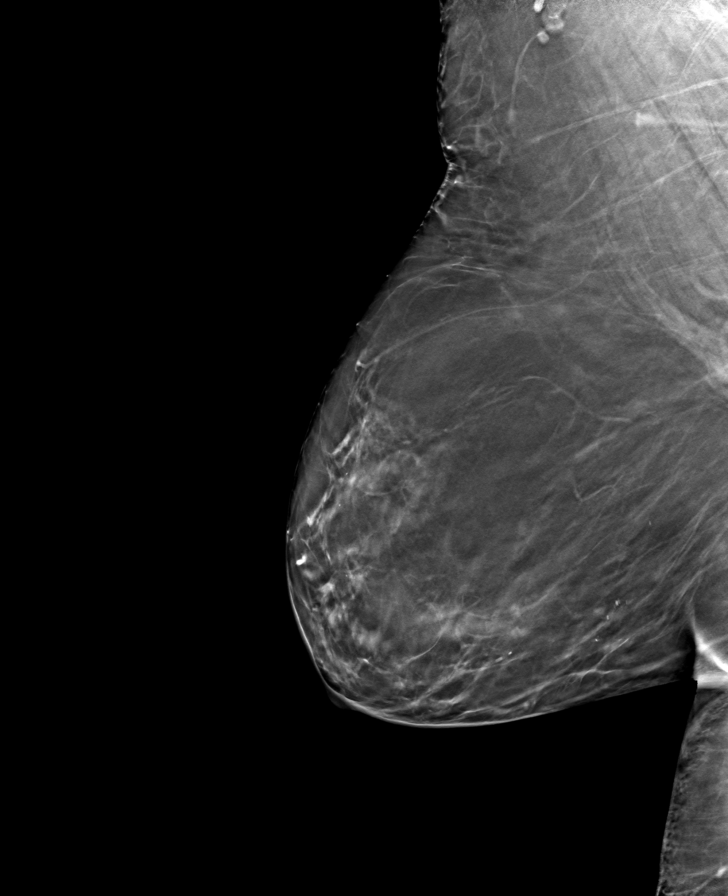

[8 of 24 positions shown; findings below may reference images not displayed]

ACR Breast Density Category b: There are scattered areas of
fibroglandular density.
FINDINGS: There are no findings suspicious for malignancy.
IMPRESSION: No mammographic evidence of malignancy. A result letter of this
screening mammogram will be mailed directly to the patient.

RECOMMENDATION:
Screening mammogram in one year. (Code:51-O-LD2)

BI-RADS CATEGORY  1: Negative.

## 2023-08-13 ENCOUNTER — Ambulatory Visit: Payer: Medicare Other

## 2023-08-13 ENCOUNTER — Ambulatory Visit
Admission: EM | Admit: 2023-08-13 | Discharge: 2023-08-13 | Disposition: A | Discharge status: Home / Self Care | Payer: Medicare Other

## 2023-08-13 DIAGNOSIS — R198 Other specified symptoms and signs involving the digestive system and abdomen: Secondary | ICD-10-CM | POA: Insufficient documentation

## 2023-08-13 DIAGNOSIS — R053 Chronic cough: Secondary | ICD-10-CM

## 2023-08-13 DIAGNOSIS — R9389 Abnormal findings on diagnostic imaging of other specified body structures: Secondary | ICD-10-CM | POA: Insufficient documentation

## 2023-08-13 DIAGNOSIS — Z6829 Body mass index (BMI) 29.0-29.9, adult: Secondary | ICD-10-CM | POA: Insufficient documentation

## 2023-08-13 DIAGNOSIS — Z955 Presence of coronary angioplasty implant and graft: Secondary | ICD-10-CM | POA: Insufficient documentation

## 2023-08-13 DIAGNOSIS — M150 Primary generalized (osteo)arthritis: Secondary | ICD-10-CM | POA: Insufficient documentation

## 2023-08-13 DIAGNOSIS — R509 Fever, unspecified: Secondary | ICD-10-CM | POA: Diagnosis not present

## 2023-08-13 DIAGNOSIS — L409 Psoriasis, unspecified: Secondary | ICD-10-CM | POA: Insufficient documentation

## 2023-08-13 DIAGNOSIS — M2560 Stiffness of unspecified joint, not elsewhere classified: Secondary | ICD-10-CM | POA: Insufficient documentation

## 2023-08-13 DIAGNOSIS — K30 Functional dyspepsia: Secondary | ICD-10-CM | POA: Insufficient documentation

## 2023-08-13 DIAGNOSIS — M2550 Pain in unspecified joint: Secondary | ICD-10-CM | POA: Insufficient documentation

## 2023-08-13 DIAGNOSIS — K2270 Barrett's esophagus without dysplasia: Secondary | ICD-10-CM | POA: Insufficient documentation

## 2023-08-13 DIAGNOSIS — E2839 Other primary ovarian failure: Secondary | ICD-10-CM | POA: Insufficient documentation

## 2023-08-13 DIAGNOSIS — F419 Anxiety disorder, unspecified: Secondary | ICD-10-CM | POA: Insufficient documentation

## 2023-08-13 DIAGNOSIS — R911 Solitary pulmonary nodule: Secondary | ICD-10-CM | POA: Insufficient documentation

## 2023-08-13 DIAGNOSIS — R300 Dysuria: Secondary | ICD-10-CM | POA: Insufficient documentation

## 2023-08-13 DIAGNOSIS — M9390 Osteochondropathy, unspecified of unspecified site: Secondary | ICD-10-CM | POA: Insufficient documentation

## 2023-08-13 DIAGNOSIS — K2100 Gastro-esophageal reflux disease with esophagitis, without bleeding: Secondary | ICD-10-CM | POA: Insufficient documentation

## 2023-08-13 DIAGNOSIS — K635 Polyp of colon: Secondary | ICD-10-CM | POA: Insufficient documentation

## 2023-08-13 DIAGNOSIS — E6609 Other obesity due to excess calories: Secondary | ICD-10-CM | POA: Insufficient documentation

## 2023-08-13 DIAGNOSIS — N8110 Cystocele, unspecified: Secondary | ICD-10-CM | POA: Insufficient documentation

## 2023-08-13 DIAGNOSIS — E663 Overweight: Secondary | ICD-10-CM | POA: Insufficient documentation

## 2023-08-13 DIAGNOSIS — Z6830 Body mass index (BMI) 30.0-30.9, adult: Secondary | ICD-10-CM | POA: Insufficient documentation

## 2023-08-13 DIAGNOSIS — R350 Frequency of micturition: Secondary | ICD-10-CM | POA: Insufficient documentation

## 2023-08-13 DIAGNOSIS — E559 Vitamin D deficiency, unspecified: Secondary | ICD-10-CM | POA: Insufficient documentation

## 2023-08-13 DIAGNOSIS — R7303 Prediabetes: Secondary | ICD-10-CM | POA: Insufficient documentation

## 2023-08-13 MED ORDER — DOXYCYCLINE HYCLATE 100 MG PO CAPS: 100.0000 mg | ORAL_CAPSULE | Freq: Two times a day (BID) | ORAL | 0 refills | Status: AC

## 2023-08-13 NOTE — ED Provider Notes (Signed)
[facility]    CSN: [id number] Arrival date & time: 08/13/23  0900      History   Chief Complaint Chief Complaint  Patient presents with   Cough    HPI Alyssa Jacobs is a 82 y.o. female.   Patient here today for evaluation of cough, congestion that started about a week ago.  She reports that 1 day last week symptoms seem to improve but then they worsened.  She is concerned because she has a productive cough with green sputum and does have a weak immune system.  She denies any fever.  She has not had any vomiting or diarrhea.  The history is provided by the patient.  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 08/13/2023   Body mass index (BMI) 29.0-29.9, adult 08/13/2023   Body mass index (BMI) 30.0-30.9, adult 08/13/2023   Female bladder prolapse 08/13/2023   Dysuria 08/13/2023   Functional dyspepsia 08/13/2023   Gastro-esophageal reflux disease with esophagitis 08/13/2023   Increased frequency of urination 08/13/2023   Obesity due to excess calories 08/13/2023   Joint stiffness 08/13/2023   Osteochondropathy 08/13/2023   Primary osteoarthritis involving multiple joints 08/13/2023   Prediabetes 08/13/2023   Polyp of colon 08/13/2023   Overweight 08/13/2023   S/P drug eluting coronary stent placement 08/13/2023   Right upper lobe pulmonary nodule 08/13/2023   Psoriasis 08/13/2023   Vitamin D deficiency 08/13/2023   Decreased estrogen level 08/13/2023    Bilateral hearing loss 04/18/2023   Impacted cerumen of right ear 04/18/2023   Dry mouth 10/15/2022   Primary osteoarthritis 10/15/2022   CAD (coronary artery disease), native coronary artery 05/06/2022   Hyperlipidemia LDL goal <70 05/06/2022   CAD (coronary artery disease) 05/06/2022   Atherosclerotic heart disease of native coronary artery without angina pectoris 05/06/2022   Rib pain 06/29/2021   Lumbar radiculopathy 09/01/2020   Low back pain 06/26/2020   Osteoarthritis of left knee 06/09/2020   History of total right knee replacement 06/09/2020   Chronic pain syndrome 09/22/2017   DDD (degenerative disc disease), cervical 09/22/2017   Degeneration of lumbar intervertebral disc 09/22/2017   Spinal stenosis of lumbar region 09/22/2017   Postop Hyponatremia 04/20/2012   OA (osteoarthritis) of knee 04/17/2012    Past Surgical History:  Procedure Laterality Date   CORONARY STENT INTERVENTION N/A 05/06/2022   Procedure: CORONARY STENT INTERVENTION;  Surgeon: Lyn Records, MD;  Location: [facility] INVASIVE CV LAB;  Service: Cardiovascular;  Laterality: N/A;   CORONARY ULTRASOUND/IVUS N/A 05/06/2022   Procedure: Intravascular Ultrasound/IVUS;  Surgeon: Lyn Records, MD;  Location: [facility] INVASIVE CV LAB;  Service: Cardiovascular;  Laterality: N/A;   EYE SURGERY     LEFT HEART CATH AND CORONARY ANGIOGRAPHY N/A 05/06/2022   Procedure: LEFT HEART CATH AND CORONARY ANGIOGRAPHY;  Surgeon: Lyn Records, MD;  Location: [facility] INVASIVE CV LAB;  Service: Cardiovascular;  Laterality: N/A;   TOTAL KNEE ARTHROPLASTY  04/17/2012   Procedure: TOTAL KNEE ARTHROPLASTY;  Surgeon: Loanne Drilling, MD;  Location: [facility];  Service: Orthopedics;  Laterality: Right;    OB History   No obstetric history on file.      Home Medications    Prior to Admission medications   Medication Sig Start Date End Date Taking? Authorizing Provider  Aspirin 81 MG CAPS Take 81 mg by mouth daily.   Yes [provider]   atorvastatin (LIPITOR) 40 MG tablet Take 40 mg by mouth daily.   Yes [provider]  doxycycline (VIBRAMYCIN) 100 MG capsule Take 1 capsule (100 mg total) by mouth 2 (two) times daily for 7 days. 08/13/23 08/20/23 Yes Tomi Bamberger, PA-C  metoprolol tartrate (LOPRESSOR) 25 MG tablet Oral   Yes [provider]  Vitamin D, Ergocalciferol, (DRISDOL) 1.25 MG (50000 UNIT) CAPS capsule Take 50,000 Units by mouth 3 (three) times a week.   Yes [provider]  Acetaminophen (TYLENOL) 325 MG CAPS 1 capsule as needed Orally every 6 hrs    [provider]  acetaminophen (TYLENOL) 500 MG tablet 2 tablets as needed Orally every 6 hrs    [provider]  azelastine (OPTIVAR) 0.05 % ophthalmic solution Place 1 drop into both eyes daily as needed (allergies).    [provider]  azelastine (OPTIVAR) 0.05 % ophthalmic solution Apply 1 drop to eye as directed.    [provider]  betamethasone dipropionate 0.05 % cream Apply 1 Application topically daily as needed (rash).    [provider]  betamethasone dipropionate 0.05 % cream Apply 1 Application topically as directed.    [provider]  Bismuth Subsalicylate (PEPTO-BISMOL MAX STRENGTH) 525 MG/15ML SUSP Take 15 mLs by mouth 4 (four) times daily.    [provider]  calcium carbonate (TUMS) 500 MG chewable tablet Chew 1 tablet by mouth 4 (four) times daily.    [provider]  estradiol (ESTRACE) 0.1 MG/GM vaginal cream Place 1 Applicatorful vaginally as directed. 11/05/22   [provider]  Multiple Vitamin (MULTIVITAMIN PO) Take 1 capsule by mouth daily.    [provider]  Multiple Vitamin (MULTIVITAMIN) capsule Take 1 capsule by mouth daily.    [provider]  neomycin-polymyxin-hydrocortisone (CORTISPORIN) 3.5-10000-1 OTIC suspension Place 4 drops into both ears 3 (three) times daily. 02/16/23   [provider]   nitroGLYCERIN (NITROSTAT) 0.4 MG SL tablet Place under the tongue. 04/12/22   [provider]  omeprazole (PRILOSEC) 40 MG capsule Take by mouth.    [provider]    Family History Family History  Problem Relation Age of Onset   Heart disease Mother     Social History Social History   Tobacco Use   Smoking status: Former    Current packs/day: 0.00    Types: Cigarettes    Quit date: 04/10/2000    Years since quitting: 23.3   Smokeless tobacco: Never  Vaping Use   Vaping status: Never Used  Substance Use Topics   Alcohol use: No   Drug use: No     Allergies   Codeine, Amoxicillin, and Sulfa antibiotics   Review of Systems Review of Systems  Constitutional:  Negative for chills and fever.  HENT:  Positive for congestion. Negative for ear pain and sore throat.   Eyes:  Negative for discharge and redness.  Respiratory:  Positive for cough. Negative for wheezing.   Gastrointestinal:  Negative for abdominal pain, diarrhea, nausea and vomiting.  Physical Exam Triage Vital Signs ED Triage Vitals  Encounter Vitals Group     BP 08/13/23 0940 (!) 152/78     Systolic BP Percentile --      Diastolic BP Percentile --      Pulse Rate 08/13/23 0940 75     Resp 08/13/23 0940 20     Temp 08/13/23 0940 98 F (36.7 C)     Temp Source 08/13/23 0940 Oral     SpO2 08/13/23 0940 98 %     Weight 08/13/23 0937 167 lb 15.9 oz (76.2 kg)     Height 08/13/23 0937 5\' 5"  (1.651 m)     Head Circumference --      Peak Flow --      Pain Score 08/13/23 0935 0     Pain Loc --      Pain Education --      Exclude from Growth Chart --    No data found.  Updated Vital Signs BP (!) 152/78 (BP Location: Left Arm) Comment: Will recheck at end of visit.  Pulse 75   Temp 98 F (36.7 C) (Oral)   Resp 20   Ht 5\' 5"  (1.651 m)   Wt 167 lb 15.9 oz (76.2 kg)   SpO2 98%   BMI 27.96 kg/m    Physical Exam Vitals and nursing note reviewed.  Constitutional:       General: She is not in acute distress.    Appearance: Normal appearance. She is not ill-appearing.  HENT:     Head: Normocephalic and atraumatic.     Nose: Congestion present.     Mouth/Throat:     Mouth: Mucous membranes are moist.     Pharynx: No oropharyngeal exudate or posterior oropharyngeal erythema.  Eyes:     Conjunctiva/sclera: Conjunctivae normal.  Cardiovascular:     Rate and Rhythm: Normal rate and regular rhythm.     Heart sounds: Normal heart sounds. No murmur heard. Pulmonary:     Effort: Pulmonary effort is normal. No respiratory distress.     Breath sounds: Wheezing and rhonchi present. No rales.     Comments: Scattered rare wheezes and rhonchi on exam, cleared with cough Skin:    General: Skin is warm and dry.  Neurological:     Mental Status: She is alert.  Psychiatric:        Mood and Affect: Mood normal.        Thought Content: Thought content normal.      UC Treatments / Results  Labs (all labs ordered are listed, but only abnormal results are displayed) Labs Reviewed - No data to display  EKG   Radiology DG Chest 2 View  Result Date: 08/13/2023 CLINICAL DATA:  Cough for 1 week. EXAM: CHEST - 2 VIEW COMPARISON:  03/04/2022 FINDINGS: Normal heart size. No pleural effusion, airspace consolidation, atelectasis or pneumothorax. Advanced degenerative changes are noted within both glenohumeral joints. No acute osseous findings identified. IMPRESSION: No acute cardiopulmonary abnormalities. Electronically Signed   By: Signa Kell M.D.   On: 08/13/2023 10:13    Procedures Procedures (including critical care time)  Medications Ordered in UC Medications - No data to display  Initial Impression / Assessment and Plan / UC Course  I have reviewed the triage vital signs and the nursing notes.  Pertinent labs & imaging results that were available during my care of the patient were reviewed by me and considered in my medical decision making (see chart for  details).  X-ray without acute findings however given reported productive cough and lingering symptoms we will treat with antibiotics.  Encouraged follow-up if no gradual improvement or with any further concerns.   Final Clinical Impressions(s) / UC Diagnoses   Final diagnoses:  Persistent cough  Fever, unspecified   Discharge Instructions   None    ED Prescriptions     Medication Sig Dispense Auth. Provider   doxycycline (VIBRAMYCIN) 100 MG capsule Take 1 capsule (100 mg total) by mouth 2 (two) times daily for 7 days. 14 capsule Tomi Bamberger, PA-C      PDMP not reviewed this encounter.   Tomi Bamberger, PA-C 08/13/23 1144

## 2023-08-13 NOTE — ED Triage Notes (Signed)
"  About a week ago started with sneezing, through this week, no fever, it has steadily gotten worse with a really bad Cough". "My concern is I have a very weak immune system, I wish to be checked for Pna".

## 2023-09-20 ENCOUNTER — Ambulatory Visit
Admission: RE | Admit: 2023-09-20 | Discharge: 2023-09-20 | Disposition: A | Discharge status: Home / Self Care | Payer: Medicare Other | Source: Ambulatory Visit | Attending: Cardiovascular Disease | Admitting: Cardiovascular Disease

## 2023-09-20 DIAGNOSIS — R911 Solitary pulmonary nodule: Secondary | ICD-10-CM

## 2023-10-06 ENCOUNTER — Encounter: Payer: Self-pay | Admitting: Cardiovascular Disease

## 2023-10-06 ENCOUNTER — Ambulatory Visit: Payer: Medicare Other | Attending: Cardiovascular Disease | Admitting: Cardiovascular Disease

## 2023-10-06 VITALS — BP 118/64 | HR 75 | Ht 66.0 in | Wt 169.0 lb

## 2023-10-06 DIAGNOSIS — R911 Solitary pulmonary nodule: Secondary | ICD-10-CM | POA: Diagnosis not present

## 2023-10-06 DIAGNOSIS — R5381 Other malaise: Secondary | ICD-10-CM | POA: Diagnosis not present

## 2023-10-06 DIAGNOSIS — E782 Mixed hyperlipidemia: Secondary | ICD-10-CM

## 2023-10-06 DIAGNOSIS — I25118 Atherosclerotic heart disease of native coronary artery with other forms of angina pectoris: Secondary | ICD-10-CM

## 2023-10-06 NOTE — Progress Notes (Signed)
Cardiology Office Note:  .   Date:  10/06/2023  ID:  Alyssa Jacobs, DOB 09-09-1941, MRN [id number] PCP: Charlane Ferretti, DO   [facility] Providers Cardiologist:  Reatha Harps, MD { History of Present Illness: .   Alyssa Jacobs is a 83 y.o. female with history of CAD and HLD who presents for follow-up.   History of Present Illness   [name], an 83 year old female with a history of CAD and hyperlipidemia, presents for a follow-up visit. She reports a noticeable decline in her energy levels and mobility, which she attributes to aging and recent illness. She mentions having had the flu and bronchitis, as indicated by a recent CT scan. The patient also expresses concern about various findings on her CT scan, including a small nodule and signs of infection. She denies experiencing chest pain but notes feeling "funny things" at night when she goes to bed. She also reports pain in her legs and shoulders, which she believes is due to arthritis. The patient has been unable to continue her regular exercise routine at the gym due to her recent illness and low energy levels. She also mentions having a prolapsed bladder, which requires frequent visits to the urologist, and a pessary has been inserted to manage this issue.          Problem List CAD -99% mid RCA -> PCI 05/06/2022 -mid LAD 60% -Ramus 80% -CAC 226 (67th percentile) 2. HLD -T chol 129, HDL 49, LDL 65, TG 75 3. Psoriatic arthritis     ROS: All other ROS reviewed and negative. Pertinent positives noted in the HPI.     Studies Reviewed: Marland Kitchen   EKG Interpretation Date/Time:  Thursday October 06 2023 15:53:15 EST Ventricular Rate:  75 PR Interval:  174 QRS Duration:  82 QT Interval:  372 QTC Calculation: 415 R Axis:   88  Text Interpretation: Normal sinus rhythm Nonspecific ST abnormality Confirmed by Lennie Odor [phone]) on 10/06/2023 4:04:09 PM   Physical Exam:   VS:  BP 118/64 (BP Location: Left Arm, Patient  Position: Sitting, Cuff Size: Normal)   Pulse 75   Ht 5\' 6"  (1.676 m)   Wt 169 lb (76.7 kg)   BMI 27.28 kg/m    Wt Readings from Last 3 Encounters:  10/06/23 169 lb (76.7 kg)  08/13/23 167 lb 15.9 oz (76.2 kg)  12/20/22 167 lb 15.9 oz (76.2 kg)    GEN: Well nourished, well developed in no acute distress NECK: No JVD; No carotid bruits CARDIAC: RRR, no murmurs, rubs, gallops RESPIRATORY:  Clear to auscultation without rales, wheezing or rhonchi  ABDOMEN: Soft, non-tender, non-distended EXTREMITIES:  No edema; No deformity  ASSESSMENT AND PLAN: .   Assessment and Plan    Coronary Artery Disease -PCI to RCA -non-obstructive LAD disease -No angina symptoms. EKG shows normal sinus rhythm with no acute ischemic changes. LDL is 65, close to goal. -Discontinue Metoprolol as this may help with energy.  -Continue Aspirin and Atorvastatin 40mg  daily.  Lung Nodule Recent chest CT shows ground glass likely due to bronchitis. No evidence of emphysema on examination. -Repeat chest CT without contrast in 6 months per radiology  Physical Deconditioning Reports low energy, recent influenza, and decreased mobility. History of beneficial response to cardiac rehab. -Order physical therapy.  Hyperlipidemia LDL is 65, close to goal. -Continue Atorvastatin 40mg  daily.  Follow-up in 1 year.              Follow-up: Return in about 1  year (around 10/05/2024).   Signed, Lenna Gilford. Flora Lipps, MD, [facility]  [facility]  [address], [address] [place], [place] [address] [phone]  6:56 PM

## 2023-10-06 NOTE — Patient Instructions (Addendum)
Medication Instructions:  STOP Metoprolol Tartrate 25 mg  *If you need a refill on your cardiac medications before your next appointment, please call your pharmacy*   Testing/Procedures: Done in 6 months Non-Cardiac CT scanning, (CAT scanning), is a noninvasive, special x-ray that produces cross-sectional images of the body using x-rays and a computer. CT scans help physicians diagnose and treat medical conditions. CT scans provide greater clarity and reveal more details than regular x-ray exams.    Follow-Up: At [facility], you and your health needs are our priority.  As part of our continuing mission to provide you with exceptional heart care, we have created designated Provider Care Teams.  These Care Teams include your primary Cardiologist (physician) and Advanced Practice Providers (APPs -  Physician Assistants and Nurse Practitioners) who all work together to provide you with the care you need, when you need it.  We recommend signing up for the patient portal called "MyChart".  Sign up information is provided on this After Visit Summary.  MyChart is used to connect with patients for Virtual Visits (Telemedicine).  Patients are able to view lab/test results, encounter notes, upcoming appointments, etc.  Non-urgent messages can be sent to your provider as well.   To learn more about what you can do with MyChart, go to [email or web address].    Your next appointment:   12 month(s)  Provider:   Theodore Demark, PA-C, Edd Fabian, FNP, Micah Flesher, PA-C, Marjie Skiff, PA-C, Robet Leu, PA-C, Juanda Crumble, PA-C, Joni Reining, DNP, ANP, Azalee Course, PA-C, Bernadene Person, NP, or Reather Littler, NP    Other instructions Dr. Flora Lipps has referred you to physical therapy

## 2023-10-26 ENCOUNTER — Ambulatory Visit: Payer: Medicare Other | Admitting: Cardiovascular Disease

## 2024-04-04 ENCOUNTER — Ambulatory Visit (HOSPITAL_BASED_OUTPATIENT_CLINIC_OR_DEPARTMENT_OTHER): Payer: Medicare Other

## 2024-04-12 ENCOUNTER — Ambulatory Visit (HOSPITAL_BASED_OUTPATIENT_CLINIC_OR_DEPARTMENT_OTHER)
Admission: RE | Admit: 2024-04-12 | Discharge: 2024-04-12 | Disposition: A | Discharge status: Home / Self Care | Source: Ambulatory Visit | Attending: Cardiovascular Disease | Admitting: Cardiovascular Disease

## 2024-04-12 DIAGNOSIS — R911 Solitary pulmonary nodule: Secondary | ICD-10-CM | POA: Diagnosis present

## 2024-04-22 ENCOUNTER — Ambulatory Visit: Payer: Self-pay | Admitting: Cardiovascular Disease

## 2024-05-01 ENCOUNTER — Other Ambulatory Visit: Payer: Self-pay | Admitting: Internal Medicine

## 2024-05-01 DIAGNOSIS — R911 Solitary pulmonary nodule: Secondary | ICD-10-CM

## 2024-05-11 ENCOUNTER — Other Ambulatory Visit: Payer: Self-pay | Admitting: Internal Medicine

## 2024-05-11 DIAGNOSIS — R911 Solitary pulmonary nodule: Secondary | ICD-10-CM

## 2024-09-27 ENCOUNTER — Encounter: Payer: Self-pay | Admitting: Cardiovascular Disease

## 2024-10-21 NOTE — Progress Notes (Unsigned)
 " Cardiology Office Note:  .   Date:  10/24/2024  ID:  Alyssa Jacobs, DOB 05-29-1941, MRN [id number] PCP: Valentin Skates, DO  [facility] Providers Cardiologist:  Darryle ONEIDA Decent, MD   History of Present Illness: .    Chief Complaint  Patient presents with   Follow-up    Alyssa Jacobs is a 84 y.o. female with below history who presents for follow-up.   History of Present Illness   Alyssa Jacobs is an 84 year old female with coronary artery disease who presents for follow-up.  She has a history of coronary artery disease and underwent percutaneous coronary intervention (PCI) to the mid right coronary artery (RCA) in 2023. She experiences shortness of breath but denies any chest discomfort. She engages in physical therapy and exercises regularly. She takes a low-dose aspirin  every night.  Her psoriatic arthritis has been exacerbated by fluctuating weather, which she describes as 'really bad' and notes that it affects her daily life. She is undergoing physical therapy, which has been beneficial.  She has a history of hyperlipidemia and was previously on Lipitor, which she has not been taking recently. Recent lipid panel results from August show total cholesterol at 170 mg/dL.  She has a prolapsed bladder for which she sees a urologist every two to three months and uses a pessary.  She reports digestive issues and manages her diet by consuming lean animal protein, fresh fruits, and vegetables, and monitoring her sodium intake. She exercises regularly, aiming for 150 minutes per week.           Problem List CAD -99% mid RCA -> PCI 05/06/2022 -mid LAD 60% -Ramus 80% -CAC 226 (67th percentile) -60% dLAD; 80% ramus  2. HLD -T chol 170, HDL 50, LDL 101, TG 97 3. Psoriatic arthritis     ROS: All other ROS reviewed and negative. Pertinent positives noted in the HPI.     Studies Reviewed: SABRA   EKG Interpretation Date/Time:  Wednesday October 24 2024 15:03:20  EST Ventricular Rate:  72 PR Interval:  168 QRS Duration:  92 QT Interval:  378 QTC Calculation: 413 R Axis:   71  Text Interpretation: Normal sinus rhythm Normal ECG Confirmed by Decent Darryle [phone]) on 10/24/2024 3:05:05 PM   LHC 05/06/2022 CONCLUSIONS: 70% followed by 99% proximal to mid RCA treated with 32 x 3.0 Synergy postdilated to 3.25 mmHg and high-pressure reducing stenosis to 0% with maintenance of TIMI grade III flow.  No complications. Left main is widely patent. LAD is widely patent and wraps around the apex.  There is mid to distal segmental 60% narrowing after the third diagonal. Ramus intermedius small to moderate in size and contains ostial to proximal 70 to 80% narrowing. Circumflex gives origin to a large branching obtuse marginal.  Distal circumflex supplies collaterals to the distal RCA. Normal LV function with normal LVEDP.  EF greater than 65%.   Physical Exam:   VS:  BP 120/70 (BP Location: Left Arm, Patient Position: Sitting, Cuff Size: Normal)   Pulse 72   Ht 5' 6 (1.676 m)   Wt 167 lb (75.8 kg)   BMI 26.95 kg/m    Wt Readings from Last 3 Encounters:  10/24/24 167 lb (75.8 kg)  10/06/23 169 lb (76.7 kg)  08/13/23 167 lb 15.9 oz (76.2 kg)    GEN: Well nourished, well developed in no acute distress NECK: No JVD; No carotid bruits CARDIAC: RRR, no murmurs, rubs, gallops RESPIRATORY:  Clear to  auscultation without rales, wheezing or rhonchi  ABDOMEN: Soft, non-tender, non-distended EXTREMITIES:  No edema; No deformity  ASSESSMENT AND PLAN: .   Assessment and Plan    Coronary artery disease, status post PCI Coronary artery disease post-PCI to mid RCA in 2023. Reports shortness of breath. EKG and echocardiogram normal. - Continue aspirin  therapy. - Encouraged regular exercise and dietary modifications.  Mixed hyperlipidemia Previously on Lipitor, not taken recently. Total cholesterol 170 mg/dL in August. Normal liver function. Genetic predisposition  noted. Emphasized cholesterol management to prevent cardiovascular events. - Restarted Lipitor 40 mg daily at night. - Scheduled follow-up lipid panel in two months. - Continue dietary modifications with lean proteins, fresh fruits, vegetables, and low sodium. - Encouraged regular exercise, 150 minutes per week.                Follow-up: Return in about 1 year (around 10/24/2025).  Signed, Darryle DASEN. Barbaraann, MD, [facility]  [facility]  [address] [place], [place] [address] [phone]  3:56 PM   "

## 2024-10-24 ENCOUNTER — Ambulatory Visit: Admitting: Cardiovascular Disease

## 2024-10-24 ENCOUNTER — Encounter: Payer: Self-pay | Admitting: Cardiovascular Disease

## 2024-10-24 VITALS — BP 120/70 | HR 72 | Ht 66.0 in | Wt 167.0 lb

## 2024-10-24 DIAGNOSIS — I25118 Atherosclerotic heart disease of native coronary artery with other forms of angina pectoris: Secondary | ICD-10-CM

## 2024-10-24 DIAGNOSIS — E782 Mixed hyperlipidemia: Secondary | ICD-10-CM | POA: Diagnosis not present

## 2024-10-24 MED ORDER — ATORVASTATIN CALCIUM 40 MG PO TABS: 40.0000 mg | ORAL_TABLET | Freq: Every day | ORAL | 3 refills | Status: AC

## 2024-10-24 NOTE — Patient Instructions (Signed)
 Medication Instructions:  RESUME Atorvastatin  (Lipitor) 40 mg. Take one (1) tablet by mouth once daily.  *If you need a refill on your cardiac medications before your next appointment, please call your pharmacy*  Lab Work: Fasting Lipid Profile in 2 months If you have labs (blood work) drawn today and your tests are completely normal, you will receive your results only by: MyChart Message (if you have MyChart) OR A paper copy in the mail If you have any lab test that is abnormal or we need to change your treatment, we will call you to review the results.  Testing/Procedures: None ordered   Follow-Up: At [facility], you and your health needs are our priority.  As part of our continuing mission to provide you with exceptional heart care, our providers are all part of one team.  This team includes your primary Cardiologist (physician) and Advanced Practice Providers or APPs (Physician Assistants and Nurse Practitioners) who all work together to provide you with the care you need, when you need it.  Your next appointment:   1 year(s)  Provider:   Darryle ONEIDA Decent, MD    We recommend signing up for the patient portal called MyChart.  Sign up information is provided on this After Visit Summary.  MyChart is used to connect with patients for Virtual Visits (Telemedicine).  Patients are able to view lab/test results, encounter notes, upcoming appointments, etc.  Non-urgent messages can be sent to your provider as well.   To learn more about what you can do with MyChart, go to [email or web address].

## 2025-05-10 ENCOUNTER — Other Ambulatory Visit
